# Patient Record
Sex: Female | Born: 1945 | Race: White | Hispanic: No | Smoking: Never smoker
Health system: Southern US, Community
[De-identification: ages and names within clinical notes are randomized; demographics above are authoritative.]

## PROBLEM LIST (undated history)

## (undated) ENCOUNTER — Emergency Department (HOSPITAL_COMMUNITY): Discharge status: Absent without leave | Payer: Self-pay

## (undated) DIAGNOSIS — K589 Irritable bowel syndrome without diarrhea: Secondary | ICD-10-CM

## (undated) HISTORY — PX: TONSILLECTOMY: SUR1361

---

## 2005-05-08 ENCOUNTER — Emergency Department (HOSPITAL_COMMUNITY): Admission: EM | Admit: 2005-05-08 | Discharge: 2005-05-09 | Payer: Self-pay | Admitting: Emergency Medicine

## 2014-12-02 ENCOUNTER — Emergency Department (HOSPITAL_COMMUNITY): Payer: Commercial Managed Care - HMO

## 2014-12-02 ENCOUNTER — Inpatient Hospital Stay (HOSPITAL_COMMUNITY)
Admission: EM | Admit: 2014-12-02 | Discharge: 2015-01-07 | DRG: 870 | Disposition: E | Payer: Commercial Managed Care - HMO | Attending: Emergency Medicine | Admitting: Emergency Medicine

## 2014-12-02 ENCOUNTER — Encounter (HOSPITAL_COMMUNITY): Payer: Self-pay | Admitting: *Deleted

## 2014-12-02 DIAGNOSIS — R1900 Intra-abdominal and pelvic swelling, mass and lump, unspecified site: Secondary | ICD-10-CM | POA: Diagnosis not present

## 2014-12-02 DIAGNOSIS — E860 Dehydration: Secondary | ICD-10-CM | POA: Diagnosis present

## 2014-12-02 DIAGNOSIS — K651 Peritoneal abscess: Secondary | ICD-10-CM | POA: Insufficient documentation

## 2014-12-02 DIAGNOSIS — B954 Other streptococcus as the cause of diseases classified elsewhere: Secondary | ICD-10-CM | POA: Diagnosis present

## 2014-12-02 DIAGNOSIS — R34 Anuria and oliguria: Secondary | ICD-10-CM | POA: Diagnosis present

## 2014-12-02 DIAGNOSIS — C801 Malignant (primary) neoplasm, unspecified: Secondary | ICD-10-CM | POA: Diagnosis present

## 2014-12-02 DIAGNOSIS — R601 Generalized edema: Secondary | ICD-10-CM | POA: Diagnosis present

## 2014-12-02 DIAGNOSIS — E871 Hypo-osmolality and hyponatremia: Secondary | ICD-10-CM | POA: Diagnosis present

## 2014-12-02 DIAGNOSIS — J9811 Atelectasis: Secondary | ICD-10-CM | POA: Diagnosis present

## 2014-12-02 DIAGNOSIS — D61818 Other pancytopenia: Secondary | ICD-10-CM | POA: Diagnosis present

## 2014-12-02 DIAGNOSIS — R591 Generalized enlarged lymph nodes: Secondary | ICD-10-CM | POA: Diagnosis present

## 2014-12-02 DIAGNOSIS — I5032 Chronic diastolic (congestive) heart failure: Secondary | ICD-10-CM | POA: Diagnosis present

## 2014-12-02 DIAGNOSIS — N179 Acute kidney failure, unspecified: Secondary | ICD-10-CM | POA: Diagnosis present

## 2014-12-02 DIAGNOSIS — R6521 Severe sepsis with septic shock: Secondary | ICD-10-CM | POA: Diagnosis present

## 2014-12-02 DIAGNOSIS — J9601 Acute respiratory failure with hypoxia: Secondary | ICD-10-CM | POA: Insufficient documentation

## 2014-12-02 DIAGNOSIS — Z978 Presence of other specified devices: Secondary | ICD-10-CM

## 2014-12-02 DIAGNOSIS — D689 Coagulation defect, unspecified: Secondary | ICD-10-CM | POA: Diagnosis present

## 2014-12-02 DIAGNOSIS — I471 Supraventricular tachycardia: Secondary | ICD-10-CM | POA: Diagnosis present

## 2014-12-02 DIAGNOSIS — E876 Hypokalemia: Secondary | ICD-10-CM | POA: Diagnosis present

## 2014-12-02 DIAGNOSIS — D6959 Other secondary thrombocytopenia: Secondary | ICD-10-CM | POA: Diagnosis present

## 2014-12-02 DIAGNOSIS — Z515 Encounter for palliative care: Secondary | ICD-10-CM

## 2014-12-02 DIAGNOSIS — Z6826 Body mass index (BMI) 26.0-26.9, adult: Secondary | ICD-10-CM | POA: Diagnosis not present

## 2014-12-02 DIAGNOSIS — J9600 Acute respiratory failure, unspecified whether with hypoxia or hypercapnia: Secondary | ICD-10-CM

## 2014-12-02 DIAGNOSIS — Z66 Do not resuscitate: Secondary | ICD-10-CM | POA: Diagnosis present

## 2014-12-02 DIAGNOSIS — E43 Unspecified severe protein-calorie malnutrition: Secondary | ICD-10-CM | POA: Diagnosis present

## 2014-12-02 DIAGNOSIS — R000 Tachycardia, unspecified: Secondary | ICD-10-CM

## 2014-12-02 DIAGNOSIS — R7989 Other specified abnormal findings of blood chemistry: Secondary | ICD-10-CM

## 2014-12-02 DIAGNOSIS — C786 Secondary malignant neoplasm of retroperitoneum and peritoneum: Secondary | ICD-10-CM | POA: Diagnosis present

## 2014-12-02 DIAGNOSIS — A419 Sepsis, unspecified organism: Principal | ICD-10-CM | POA: Diagnosis present

## 2014-12-02 DIAGNOSIS — R109 Unspecified abdominal pain: Secondary | ICD-10-CM

## 2014-12-02 DIAGNOSIS — K631 Perforation of intestine (nontraumatic): Secondary | ICD-10-CM | POA: Diagnosis present

## 2014-12-02 DIAGNOSIS — K567 Ileus, unspecified: Secondary | ICD-10-CM | POA: Diagnosis present

## 2014-12-02 DIAGNOSIS — D638 Anemia in other chronic diseases classified elsewhere: Secondary | ICD-10-CM | POA: Diagnosis present

## 2014-12-02 DIAGNOSIS — G92 Toxic encephalopathy: Secondary | ICD-10-CM | POA: Diagnosis present

## 2014-12-02 DIAGNOSIS — K589 Irritable bowel syndrome without diarrhea: Secondary | ICD-10-CM | POA: Diagnosis present

## 2014-12-02 DIAGNOSIS — R0602 Shortness of breath: Secondary | ICD-10-CM

## 2014-12-02 DIAGNOSIS — K630 Abscess of intestine: Secondary | ICD-10-CM | POA: Insufficient documentation

## 2014-12-02 DIAGNOSIS — N289 Disorder of kidney and ureter, unspecified: Secondary | ICD-10-CM

## 2014-12-02 DIAGNOSIS — Z820 Family history of epilepsy and other diseases of the nervous system: Secondary | ICD-10-CM | POA: Diagnosis not present

## 2014-12-02 DIAGNOSIS — N838 Other noninflammatory disorders of ovary, fallopian tube and broad ligament: Secondary | ICD-10-CM | POA: Insufficient documentation

## 2014-12-02 DIAGNOSIS — M7989 Other specified soft tissue disorders: Secondary | ICD-10-CM

## 2014-12-02 DIAGNOSIS — R531 Weakness: Secondary | ICD-10-CM | POA: Diagnosis present

## 2014-12-02 DIAGNOSIS — K5660 Unspecified intestinal obstruction: Secondary | ICD-10-CM | POA: Diagnosis present

## 2014-12-02 DIAGNOSIS — Z452 Encounter for adjustment and management of vascular access device: Secondary | ICD-10-CM

## 2014-12-02 HISTORY — DX: Irritable bowel syndrome without diarrhea: K58.9

## 2014-12-02 LAB — CBC WITH DIFFERENTIAL/PLATELET
BASOS ABS: 0 10*3/uL (ref 0.0–0.1)
Basophils Relative: 0 % (ref 0–1)
EOS PCT: 0 % (ref 0–5)
Eosinophils Absolute: 0 10*3/uL (ref 0.0–0.7)
HEMATOCRIT: 27.3 % — AB (ref 36.0–46.0)
Hemoglobin: 9.3 g/dL — ABNORMAL LOW (ref 12.0–15.0)
LYMPHS ABS: 1.2 10*3/uL (ref 0.7–4.0)
Lymphocytes Relative: 4 % — ABNORMAL LOW (ref 12–46)
MCH: 26.6 pg (ref 26.0–34.0)
MCHC: 34.1 g/dL (ref 30.0–36.0)
MCV: 78 fL (ref 78.0–100.0)
MONO ABS: 0.3 10*3/uL (ref 0.1–1.0)
MONOS PCT: 1 % — AB (ref 3–12)
NEUTROS ABS: 28.2 10*3/uL — AB (ref 1.7–7.7)
Neutrophils Relative %: 95 % — ABNORMAL HIGH (ref 43–77)
Platelets: 480 10*3/uL — ABNORMAL HIGH (ref 150–400)
RBC: 3.5 MIL/uL — ABNORMAL LOW (ref 3.87–5.11)
RDW: 18.1 % — AB (ref 11.5–15.5)
WBC Morphology: INCREASED
WBC: 29.7 10*3/uL — ABNORMAL HIGH (ref 4.0–10.5)

## 2014-12-02 LAB — COMPREHENSIVE METABOLIC PANEL
ALK PHOS: 87 U/L (ref 39–117)
ALT: 14 U/L (ref 0–35)
ANION GAP: 13 (ref 5–15)
AST: 21 U/L (ref 0–37)
Albumin: 1.8 g/dL — ABNORMAL LOW (ref 3.5–5.2)
BUN: 33 mg/dL — AB (ref 6–23)
CO2: 24 mmol/L (ref 19–32)
CREATININE: 1.53 mg/dL — AB (ref 0.50–1.10)
Calcium: 6.8 mg/dL — ABNORMAL LOW (ref 8.4–10.5)
Chloride: 95 mmol/L — ABNORMAL LOW (ref 96–112)
GFR calc non Af Amer: 33 mL/min — ABNORMAL LOW (ref >90)
GFR, EST AFRICAN AMERICAN: 39 mL/min — AB (ref >90)
GLUCOSE: 134 mg/dL — AB (ref 70–99)
Potassium: 2.5 mmol/L — CL (ref 3.5–5.1)
SODIUM: 132 mmol/L — AB (ref 135–145)
TOTAL PROTEIN: 5.7 g/dL — AB (ref 6.0–8.3)
Total Bilirubin: 1.4 mg/dL — ABNORMAL HIGH (ref 0.3–1.2)

## 2014-12-02 LAB — URINALYSIS, ROUTINE W REFLEX MICROSCOPIC
Glucose, UA: NEGATIVE mg/dL
Ketones, ur: NEGATIVE mg/dL
NITRITE: NEGATIVE
PH: 5 (ref 5.0–8.0)
PROTEIN: NEGATIVE mg/dL
SPECIFIC GRAVITY, URINE: 1.021 (ref 1.005–1.030)
UROBILINOGEN UA: 1 mg/dL (ref 0.0–1.0)

## 2014-12-02 LAB — URINE MICROSCOPIC-ADD ON

## 2014-12-02 LAB — I-STAT CG4 LACTIC ACID, ED: LACTIC ACID, VENOUS: 2.85 mmol/L — AB (ref 0.5–2.0)

## 2014-12-02 LAB — LIPASE, BLOOD: LIPASE: 10 U/L — AB (ref 11–59)

## 2014-12-02 LAB — I-STAT TROPONIN, ED: Troponin i, poc: 0 ng/mL (ref 0.00–0.08)

## 2014-12-02 LAB — BRAIN NATRIURETIC PEPTIDE: B NATRIURETIC PEPTIDE 5: 137.7 pg/mL — AB (ref 0.0–100.0)

## 2014-12-02 MED ORDER — IOHEXOL 300 MG/ML  SOLN
50.0000 mL | Freq: Once | INTRAMUSCULAR | Status: AC | PRN
  Administered 2014-12-02: 50 mL via ORAL

## 2014-12-02 MED ORDER — PIPERACILLIN-TAZOBACTAM 3.375 G IVPB
3.3750 g | Freq: Once | INTRAVENOUS | Status: AC
  Administered 2014-12-02: 3.375 g via INTRAVENOUS
  Filled 2014-12-02: qty 50

## 2014-12-02 MED ORDER — PIPERACILLIN-TAZOBACTAM 3.375 G IVPB
3.3750 g | Freq: Three times a day (TID) | INTRAVENOUS | Status: DC
  Administered 2014-12-03 (×2): 3.375 g via INTRAVENOUS
  Filled 2014-12-02 (×3): qty 50

## 2014-12-02 MED ORDER — POTASSIUM CHLORIDE 10 MEQ/100ML IV SOLN
10.0000 meq | INTRAVENOUS | Status: AC
  Administered 2014-12-02 – 2014-12-03 (×6): 10 meq via INTRAVENOUS
  Filled 2014-12-02 (×5): qty 100

## 2014-12-02 MED ORDER — ACETAMINOPHEN 650 MG RE SUPP: 650.0000 mg | Freq: Four times a day (QID) | RECTAL | Status: DC | PRN

## 2014-12-02 MED ORDER — SODIUM CHLORIDE 0.9 % IJ SOLN
3.0000 mL | Freq: Two times a day (BID) | INTRAMUSCULAR | Status: DC
  Administered 2014-12-04 – 2014-12-10 (×7): 3 mL via INTRAVENOUS

## 2014-12-02 MED ORDER — SODIUM CHLORIDE 0.9 % IV BOLUS (SEPSIS)
1000.0000 mL | Freq: Once | INTRAVENOUS | Status: AC
  Administered 2014-12-02: 1000 mL via INTRAVENOUS

## 2014-12-02 MED ORDER — POTASSIUM CHLORIDE IN NACL 40-0.9 MEQ/L-% IV SOLN
INTRAVENOUS | Status: DC
  Administered 2014-12-03: 100 mL/h via INTRAVENOUS
  Filled 2014-12-02 (×4): qty 1000

## 2014-12-02 MED ORDER — ACETAMINOPHEN 325 MG PO TABS: 650.0000 mg | ORAL_TABLET | Freq: Four times a day (QID) | ORAL | Status: DC | PRN

## 2014-12-02 NOTE — H&P (Addendum)
Nicole Cannon is an 69 y.o. female.    Nicole Cannon (pcp, [name])  Chief Complaint: weakness HPI: 69 yo female with c/o generalized weakness while at work.  Pt notes swelling in her legs for the past 2 weeks.  Pt also notes slight abdominal distention. No prior colonoscopy. Presented to ED for evaluation and found to be hypokalemic, and mild renal insufficiency.  Pt had CT scan which showed ? Pelvic mass and ? Pelvic carcinomatosis. Pt had elevated wbc but denies fever, n/v, abd pain, diarrhea, constipation, brbpr, black stool.  Pt will be admitted for w/up of ? Abscess, carcinomatosis, hypokalemia and mild renal insufficiency.   Past Medical History  Diagnosis Date  . Irritable bowel     Past Surgical History  Procedure Laterality Date  . Tonsillectomy      Family History  Problem Relation Age of Onset  . Dementia Father   . Parkinson's disease Father    Social History:  reports that she has never smoked. She does not have any smokeless tobacco history on file. She reports that she does not drink alcohol. Her drug history is not on file.  Allergies: No Known Allergies Medications reviewed   (Not in a hospital admission)  Results for orders placed or performed during the hospital encounter of 11/10/2014 (from the past 48 hour(s))  CBC with Differential     Status: Abnormal   Collection Time: 11/28/2014  4:01 PM  Result Value Ref Range   WBC 29.7 (H) 4.0 - 10.5 K/uL   RBC 3.50 (L) 3.87 - 5.11 MIL/uL   Hemoglobin 9.3 (L) 12.0 - 15.0 g/dL   HCT 27.3 (L) 36.0 - 46.0 %   MCV 78.0 78.0 - 100.0 fL   MCH 26.6 26.0 - 34.0 pg   MCHC 34.1 30.0 - 36.0 g/dL   RDW 18.1 (H) 11.5 - 15.5 %   Platelets 480 (H) 150 - 400 K/uL   Neutrophils Relative % 95 (H) 43 - 77 %   Lymphocytes Relative 4 (L) 12 - 46 %   Monocytes Relative 1 (L) 3 - 12 %   Eosinophils Relative 0 0 - 5 %   Basophils Relative 0 0 - 1 %   Neutro Abs 28.2 (H) 1.7 - 7.7 K/uL   Lymphs Abs 1.2 0.7 - 4.0 K/uL    Monocytes Absolute 0.3 0.1 - 1.0 K/uL   Eosinophils Absolute 0.0 0.0 - 0.7 K/uL   Basophils Absolute 0.0 0.0 - 0.1 K/uL   RBC Morphology POLYCHROMASIA PRESENT    WBC Morphology INCREASED BANDS (>20% BANDS)     Comment: MILD LEFT SHIFT (1-5% METAS, OCC MYELO, OCC BANDS) TOXIC GRANULATION VACUOLATED NEUTROPHILS   Comprehensive metabolic panel     Status: Abnormal   Collection Time: 11/24/2014  4:01 PM  Result Value Ref Range   Sodium 132 (L) 135 - 145 mmol/L   Potassium 2.5 (LL) 3.5 - 5.1 mmol/L    Comment: RESULT REPEATED AND VERIFIED CRITICAL RESULT CALLED TO, READ BACK BY AND VERIFIED WITH: E.BLAKELY AT 1648 ON 11/10/2014 BY S.VANHOORNE    Chloride 95 (L) 96 - 112 mmol/L   CO2 24 19 - 32 mmol/L   Glucose, Bld 134 (H) 70 - 99 mg/dL   BUN 33 (H) 6 - 23 mg/dL   Creatinine, Ser 1.53 (H) 0.50 - 1.10 mg/dL   Calcium 6.8 (L) 8.4 - 10.5 mg/dL   Total Protein 5.7 (L) 6.0 - 8.3 g/dL   Albumin 1.8 (L) 3.5 -  5.2 g/dL   AST 21 0 - 37 U/L   ALT 14 0 - 35 U/L   Alkaline Phosphatase 87 39 - 117 U/L   Total Bilirubin 1.4 (H) 0.3 - 1.2 mg/dL   GFR calc non Af Amer 33 (L) >90 mL/min   GFR calc Af Amer 39 (L) >90 mL/min    Comment: (NOTE) The eGFR has been calculated using the CKD EPI equation. This calculation has not been validated in all clinical situations. eGFR's persistently <90 mL/min signify possible Chronic Kidney Disease.    Anion gap 13 5 - 15  Lipase, blood     Status: Abnormal   Collection Time: 11/27/2014  4:01 PM  Result Value Ref Range   Lipase 10 (L) 11 - 59 U/L  Brain natriuretic peptide     Status: Abnormal   Collection Time: 11/25/2014  4:01 PM  Result Value Ref Range   B Natriuretic Peptide 137.7 (H) 0.0 - 100.0 pg/mL  I-stat troponin, ED     Status: None   Collection Time: 11/29/2014  4:12 PM  Result Value Ref Range   Troponin i, poc 0.00 0.00 - 0.08 ng/mL   Comment 3            Comment: Due to the release kinetics of cTnI, a negative result within the first hours of the  onset of symptoms does not rule out myocardial infarction with certainty. If myocardial infarction is still suspected, repeat the test at appropriate intervals.   I-Stat CG4 Lactic Acid, ED     Status: Abnormal   Collection Time: 11/26/2014  4:15 PM  Result Value Ref Range   Lactic Acid, Venous 2.85 (HH) 0.5 - 2.0 mmol/L   Comment NOTIFIED PHYSICIAN   Urinalysis, Routine w reflex microscopic     Status: Abnormal   Collection Time: 11/20/2014  5:33 PM  Result Value Ref Range   Color, Urine ORANGE (A) YELLOW    Comment: BIOCHEMICALS MAY BE AFFECTED BY COLOR   APPearance TURBID (A) CLEAR   Specific Gravity, Urine 1.021 1.005 - 1.030   pH 5.0 5.0 - 8.0   Glucose, UA NEGATIVE NEGATIVE mg/dL   Hgb urine dipstick TRACE (A) NEGATIVE   Bilirubin Urine MODERATE (A) NEGATIVE   Ketones, ur NEGATIVE NEGATIVE mg/dL   Protein, ur NEGATIVE NEGATIVE mg/dL   Urobilinogen, UA 1.0 0.0 - 1.0 mg/dL   Nitrite NEGATIVE NEGATIVE   Leukocytes, UA TRACE (A) NEGATIVE  Urine microscopic-add on     Status: Abnormal   Collection Time: 11/17/2014  5:33 PM  Result Value Ref Range   Squamous Epithelial / LPF RARE RARE   WBC, UA 3-6 <3 WBC/hpf   RBC / HPF 0-2 <3 RBC/hpf   Bacteria, UA MANY (A) RARE   Casts HYALINE CASTS (A) NEGATIVE   Ct Abdomen Pelvis Wo Contrast  11/08/2014   CLINICAL DATA:  Edema in the lower extremities beginning about 3 weeks ago and progressively worsening. Weakness beginning this week worsening. Diffuse abdominal pain and nausea for 2 weeks.  EXAM: CT ABDOMEN AND PELVIS WITHOUT CONTRAST  TECHNIQUE: Multidetector CT imaging of the abdomen and pelvis was performed following the standard protocol without IV contrast.  COMPARISON:  None.  FINDINGS: Small right pleural effusion with basilar atelectasis. Small pericardial effusion. Mild cardiac enlargement.  Evaluation of solid organs and vascular structures is limited without IV contrast material. Evaluation of bowel is limited without oral contrast.  Loculated appearing fluid collections are demonstrated in the right upper quadrant posterior  to the liver and extending inferior to the liver along the right lateral abdominal and pelvic wall. Additional fluid collections are suggested in the mesenteric. There is an air-fluid level in the central mesentery to the left which could represent a focal dilated bowel loop or an abscess. Loculated hyperdense fluid collections versus mass lesions in the pelvis. Nodular lesions in the mesentery. No free air. Small bowel are mildly dilated with diffuse wall thickening. Bladder is decompressed with mild wall thickening. I believe that the changes with most likely represent pelvic masses with diffuse peritoneal carcinomatosis and associated fluid collections. Abscess or inflammatory process are not excluded. Diffuse enteritis is not excluded. Repeat imaging with oral and IV contrast material would be helpful to better define the abnormalities if the patient can't tolerate contrast.  Tiny subcentimeter lesion in the right lobe of the liver is indeterminate but probably represents a cyst. Gallbladder is mildly contracted. No stones or wall thickening. No bile duct dilatation. Unenhanced appearance of the spleen, pancreas, adrenal glands, kidneys, abdominal aorta, and inferior vena cava is unremarkable. Scattered retroperitoneal and mesenteric lymph nodes are not pathologically enlarged. No free air in the abdomen.  Pelvis: See above. 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 No destructive bone lesions appreciated. Diffuse soft tissue edema.  IMPRESSION: Examination is technically limited without IV contrast material and oral contrast material. 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 Small bowel are mildly dilated with diffusely thickened wall, possibly due to tumor infiltration, reactive inflammation, or enteritis. Central  abdominal abscess is not excluded. Multiple loculated fluid collections. Small right pleural effusion with basilar atelectasis. Diffuse edema.   Electronically Signed   By: Lucienne Capers M.D.   On: 11/16/2014 19:13   Dg Chest 2 View  12/05/2014   CLINICAL DATA:  Shortness of breath, leg swelling  EXAM: CHEST  2 VIEW  COMPARISON:  None.  FINDINGS: Cardiomediastinal silhouette is unremarkable. Mild degenerative changes thoracic spine. There is elevation of the right hemidiaphragm. No acute infiltrate or pulmonary edema. Mild deformity of proximal shaft of the right humerus question prior fracture.  IMPRESSION: No active disease. There is elevation of the right hemidiaphragm. Mild degenerative changes thoracic spine. Question prior fracture proximal shaft of right humerus.   Electronically Signed   By: Lahoma Crocker M.D.   On: 11/20/2014 16:18    Review of Systems  Constitutional: Positive for weight loss. Negative for fever, chills, malaise/fatigue and diaphoresis.       50 lbs since June  HENT: Negative for congestion, ear discharge, ear pain, hearing loss, nosebleeds, sore throat and tinnitus.   Eyes: Negative for blurred vision, double vision, photophobia, pain, discharge and redness.  Respiratory: Negative for cough, hemoptysis, sputum production, shortness of breath, wheezing and stridor.   Cardiovascular: Negative for chest pain, palpitations, orthopnea, claudication, leg swelling and PND.  Gastrointestinal: Negative for heartburn, nausea, vomiting, abdominal pain, diarrhea, constipation, blood in stool and melena.  Genitourinary: Negative for dysuria, urgency, frequency, hematuria and flank pain.  Musculoskeletal: Negative for myalgias, back pain, joint pain, falls and neck pain.  Skin: Negative for itching and rash.  Neurological: Negative for dizziness, tingling, tremors, sensory change, speech change, focal weakness, seizures, loss of consciousness, weakness and headaches.   Endo/Heme/Allergies: Negative for environmental allergies and polydipsia. Does not bruise/bleed easily.  Psychiatric/Behavioral: Negative for depression, suicidal ideas, hallucinations, memory loss and substance abuse. The patient is not nervous/anxious and does not have insomnia.  Blood pressure 122/56, pulse 99, temperature 97 F (36.1 C), temperature source Oral, resp. rate 36, SpO2 94 %. Physical Exam  Constitutional: She is oriented to person, place, and time. She appears well-developed and well-nourished.  HENT:  Head: Normocephalic and atraumatic.  Mouth/Throat: No oropharyngeal exudate.  Eyes: Conjunctivae and EOM are normal. Pupils are equal, round, and reactive to light. No scleral icterus.  Neck: Normal range of motion. Neck supple. No JVD present. No tracheal deviation present. No thyromegaly present.  Cardiovascular: Normal rate and regular rhythm.  Exam reveals no gallop and no friction rub.   No murmur heard. Respiratory: Effort normal and breath sounds normal. No respiratory distress. She has no wheezes. She has no rales. She exhibits no tenderness.  GI: Soft. Bowel sounds are normal. She exhibits distension. There is no tenderness. There is no rebound and no guarding.  Musculoskeletal: Normal range of motion. She exhibits edema. She exhibits no tenderness.  Lymphadenopathy:    She has no cervical adenopathy.  Neurological: She is alert and oriented to person, place, and time. She has normal reflexes. She displays normal reflexes. No cranial nerve deficit. She exhibits normal muscle tone. Coordination normal.  Skin: Skin is warm and dry. No rash noted. No erythema. No pallor.  Psychiatric: She has a normal mood and affect. Her behavior is normal. Judgment and thought content normal.     Assessment/Plan Hypokalemia Replete potassium Check magnesium  Check cmp in am  Leukocytosis Infection of some sort  ? Abscess Cover with zosyn iv pharmacy to dose Repeat CT scan  with oral and iv contrast in am if creatinine is improving  Pelvic mass Appreciate surgery input Check LDH, CEA, CA 125  Renal insufficiency Hydrate with ns iv Check cmp in am  Anemia Check cbc in am Check iron studies, b12, folate, tsh, esr, pt, ptt  Lower ext edema Check lower ext ultrasound r/o DVT in setting of possible malignancy  Tachycardia Check trop i 16h x3 Check d dimer, if positive, then lovenox 80m /kg [place] x1 and then order CTA chest vs VQ scan in am Consider echo if tachycardia persistent  DVT prophylaxis: scd KJani Gravel2/25/2016, 10:33 PM

## 2014-12-02 NOTE — ED Notes (Signed)
[place], [place] made aware of patient CG4 Lactic results.

## 2014-12-02 NOTE — ED Notes (Signed)
Patient began to notice edema to bilateral extremities about 3 weeks ago that has gotten progressively worse. Patient denies previous history and the weakness began the beginning of this week and has gotten progressively worse to the point where the patient was unable to get up today.

## 2014-12-02 NOTE — ED Notes (Signed)
Critical Value: K 2.5 Called by Dot Been.

## 2014-12-02 NOTE — Progress Notes (Signed)
EDCM spoke to patient and [name] at bedside.  Patient confirms she lives at home alone.  Patient reports her pcp is Dr. Briscoe Deutscher who she saw a "few months ago."  Patient reports she does not have any home health services and never has.  Patient reports she is able to perform all of her ADL's without assistance.  Patient's son Trevor's phone number [phone] and her neighbor Renne Musca [phone].  Patient without EDCM needs at this time.

## 2014-12-02 NOTE — ED Notes (Signed)
Attending at bedside.

## 2014-12-02 NOTE — Consult Note (Addendum)
Re:   Nicole Cannon DOB:   [birth year] MRN:   [id number]   [facility] consultation  ASSESSMENT AND PLAN: 1.  Intra-abdominal abscess  Agree with antibiotics.  Plan repeat CT scan tomorrow with at least oral contrast to better define the bowel/abscess  Then consider perc drain as possible.  She would not tolerate a laparotomy well at this time.  Discussed plan with Dr. Georges Mouse 2.  Question intra-abdominal tumor mass vs carcinomatosis  She appears to have a tumor mass.  Repeat CT scan may define this better.  Will need tissue via IR if possible. 3.  Elevated creatinine  4.  Malnourished  Albumin - 1.9  Consider TPN while sorting her diagnoses out. 5.  Anemia  Probably will go down further with hydration 6.  Lower extremity edema  Proably secondary to malnutrition, possible lymphatic obstruction 7.  Hypokalemia  Chief Complaint  Patient presents with  . Leg Swelling  . Weakness   REFERRING PHYSICIAN: No PCP Per Patient  HISTORY OF PRESENT ILLNESS: Nicole Cannon is a 69 y.o. (DOB: 1946-03-08)  white  female whose primary care physician is No PCP Per Patient (she sees Dr. Maceo Pro as her PCP) and comes to the [facility] by ambulance for swelling legs and poor appetitie. Her son, Sherrilyn Rist and wife, Adria Devon, are in the room.  This is a strange story.  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  Over the last several months, she has had "colitis" and gone to an alternative doctor.  She could not remember the alternative doctors name (or did not want to give it to me).  He treated her colitis with a diet.  She denies any meds.  Then over the last 3 weeks, she has noticed increased swelling of her lower extremities.  She has had increased difficulty walking and could not drive her car to work.  Her neighbor thought she was having a stroke and called the ambulance.  She said it is "painful to eat".  She as lost 50 pounds.  She attributed this weight loss to the diet she was put on for the  colitis.  She has not had a colonoscopy.  She has no prior GI history, until 3 months ago.  CT abdomen - 11/08/2014 - Examination is technically limited without IV contrast material and oral contrast material. 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 Small bowel are mildly dilated with diffusely thickened wall, possibly due to tumor infiltration, reactive inflammation, or enteritis. Central abdominal abscess is not excluded. Multiple loculated fluid collections. Small right pleural effusion with basilar atelectasis. Diffuse edema.  WBC - 29,700 - 12/04/2014 Hgb - 9.3 - 11/26/2014 Creat - 1.53 - 11/12/2014 Albumin - 1.8    History reviewed. No pertinent past medical history.    Past Surgical History  Procedure Laterality Date  . Tonsillectomy        Current Facility-Administered Medications  Medication Dose Route Frequency Provider Last Rate Last Dose  . piperacillin-tazobactam (ZOSYN) IVPB 3.375 g  3.375 g Intravenous Once Blanchie Dessert, MD 12.5 mL/hr at 11/23/2014 2028 3.375 g at 11/28/2014 2028  . potassium chloride 10 mEq in 100 mL IVPB  10 mEq Intravenous Q1 Hr x 6 Carrie Mew, PA-C 100 mL/hr at 11/30/2014 2114 10 mEq at 11/21/2014 2114   No current outpatient prescriptions on file.     No Known Allergies  REVIEW OF SYSTEMS: Skin:  No  history of rash.  No history of abnormal moles. Infection:  No history of hepatitis or HIV.  No history of MRSA. Neurologic:  No history of stroke.  No history of seizure.  No history of headaches. Cardiac:  No history of hypertension. No history of heart disease.  No history of prior cardiac catheterization.  No history of seeing a cardiologist. Pulmonary:  Does not smoke cigarettes.  No asthma or bronchitis.  No OSA/CPAP.  Endocrine:  No diabetes. No thyroid disease. Gastrointestinal:  No history of stomach disease.  No history of liver disease.  No  history of gall bladder disease.  No history of pancreas disease.  No history of colon disease. Urologic:  No history of kidney stones.  No history of bladder infections. Musculoskeletal:  No history of joint or back disease. Hematologic:  No bleeding disorder.  No history of anemia.  Not anticoagulated. Psycho-social:  The patient is oriented.  I think she is in some denial of her condition. Somewhat odd affect.  SOCIAL and FAMILY HISTORY: Divorced.  She lives by herself. Her son, Sherrilyn Rist and wife, Adria Devon, are in the room.  [name] was texting/typing on the cell phone most of the time I was interviewing his mother. She has one other son who lives near here.  PHYSICAL EXAM: BP 122/56 mmHg  Pulse 99  Temp(Src) 97 F (36.1 C) (Oral)  Resp 36  SpO2 94%  General: Older WF who is alert, but does not look healthy. HEENT: Normal. Pupils equal. Neck: Supple. No mass.  No thyroid mass. Lymph Nodes:  No supraclavicular or cervical nodes. Lungs: Clear to auscultation and symmetric breath sounds. Heart:  Tachycardic.  Has 2/6 systolic murmur. Breasts:  No mass  Abdomen: Diffusely distended.  Somewhat tender.  Firm.  No discrete mass. Rectal: Not done. Extremities:  3+ lower extremity edema.  She moves both lower extremities, but slowly and she says it hurts. Neurologic:  Grossly intact to motor and sensory function. Psychiatric: Has normal mood and affect.   DATA REVIEWED: Epic notes  Alphonsa Overall, MD,  [facility], [place] [address]  [place], [place]    [place] Phone:  [phone] FAX:  [phone]

## 2014-12-02 NOTE — ED Notes (Signed)
Pt. Currently on 2nd bag of Potassium chloride IV.

## 2014-12-02 NOTE — ED Provider Notes (Signed)
CSN: [id number]     Arrival date & time 11/25/2014  1445 History   First MD Initiated Contact with Patient 11/27/2014 1501     Chief Complaint  Patient presents with  . Leg Swelling  . Weakness     (Consider location/radiation/quality/duration/timing/severity/associated sxs/prior Treatment) HPI Nicole Cannon is a 69 year old female with no known past medical history who presents the ER complaining of swelling to bilateral lower extremities along with generalized weakness and mild shortness of breath. Patient reports edema in her extremities began approximately 3 weeks ago after having a "several week long episode of colitis". Patient states the swelling has gotten progressively worse over the past 3 weeks. Patient reports associated weakness in her leg stating it is hard to move her legs due to the swelling. Patient reports mild shortness of breath increasing over the past several days, denies having any symptoms similar to this in the past. Patient also reports an occasional sharp, diffuse abdominal pain which last happened approximately 3 days ago. She states this pain resolved on its own. Patient denies dizziness, blurred vision, headache, chest pain, cough, fever, nausea, vomiting, diarrhea, dysuria.  Past Medical History  Diagnosis Date  . Irritable bowel    Past Surgical History  Procedure Laterality Date  . Tonsillectomy     Family History  Problem Relation Age of Onset  . Dementia Father   . Parkinson's disease Father    History  Substance Use Topics  . Smoking status: Never Smoker   . Smokeless tobacco: Not on file  . Alcohol Use: No   OB History    No data available     Review of Systems  Constitutional: Negative for fever.  HENT: Negative for trouble swallowing.   Eyes: Negative for visual disturbance.  Respiratory: Positive for shortness of breath.   Cardiovascular: Negative for chest pain.  Gastrointestinal: Positive for abdominal pain. Negative for nausea and  vomiting.  Genitourinary: Negative for dysuria.  Musculoskeletal: Negative for neck pain.  Skin: Negative for rash.       Pedal edema  Neurological: Negative for dizziness and numbness.  Psychiatric/Behavioral: Negative.       Allergies  Review of patient's allergies indicates no known allergies.  Home Medications   Prior to Admission medications   Not on File   BP 152/77 mmHg  Pulse 101  Temp(Src) 97.7 F (36.5 C) (Oral)  Resp 22  SpO2 96% Physical Exam  Constitutional: She is oriented to person, place, and time. She appears well-developed and well-nourished. No distress.  Elderly, frail appearing female in no obvious distress.  HENT:  Head: Normocephalic and atraumatic.  Mouth/Throat: Oropharynx is clear and moist. No oropharyngeal exudate.  Eyes: Right eye exhibits no discharge. Left eye exhibits no discharge. No scleral icterus.  Neck: Normal range of motion.  Cardiovascular: Regular rhythm, S1 normal, S2 normal and normal heart sounds.  Tachycardia present.   No murmur heard. Tachycardic at 105  Pulmonary/Chest: Effort normal and breath sounds normal. No accessory muscle usage. No tachypnea. No respiratory distress.  Abdominal: Soft. There is no tenderness.  Musculoskeletal: Normal range of motion. She exhibits no edema or tenderness.  Lymphadenopathy:  4+ pitting edema noted to feet bilaterally extending up to knees. 2+ pitting edema noted from knees approximately 2 upper thighs. 2+ pitting edema also noted in hands.  Neurological: She is alert and oriented to person, place, and time. She has normal strength. No cranial nerve deficit or sensory deficit. Coordination normal. GCS eye subscore is 4. GCS  verbal subscore is 5. GCS motor subscore is 6.  Patient fully alert, answering questions appropriately in full, clear sentences. Cranial nerves II through XII grossly intact. Motor strength 5 out of 5 in all major muscle groups of upper and lower extremities, however exam  of hip and knee flexors limited due to pedal edema. Distal sensation intact.  Skin: Skin is warm and dry. No rash noted. She is not diaphoretic.  Psychiatric: She has a normal mood and affect.  Nursing note and vitals reviewed.   ED Course  Procedures (including critical care time) Labs Review Labs Reviewed  CBC WITH DIFFERENTIAL/PLATELET - Abnormal; Notable for the following:    WBC 29.7 (*)    RBC 3.50 (*)    Hemoglobin 9.3 (*)    HCT 27.3 (*)    RDW 18.1 (*)    Platelets 480 (*)    Neutrophils Relative % 95 (*)    Lymphocytes Relative 4 (*)    Monocytes Relative 1 (*)    Neutro Abs 28.2 (*)    All other components within normal limits  COMPREHENSIVE METABOLIC PANEL - Abnormal; Notable for the following:    Sodium 132 (*)    Potassium 2.5 (*)    Chloride 95 (*)    Glucose, Bld 134 (*)    BUN 33 (*)    Creatinine, Ser 1.53 (*)    Calcium 6.8 (*)    Total Protein 5.7 (*)    Albumin 1.8 (*)    Total Bilirubin 1.4 (*)    GFR calc non Af Amer 33 (*)    GFR calc Af Amer 39 (*)    All other components within normal limits  LIPASE, BLOOD - Abnormal; Notable for the following:    Lipase 10 (*)    All other components within normal limits  URINALYSIS, ROUTINE W REFLEX MICROSCOPIC - Abnormal; Notable for the following:    Color, Urine ORANGE (*)    APPearance TURBID (*)    Hgb urine dipstick TRACE (*)    Bilirubin Urine MODERATE (*)    Leukocytes, UA TRACE (*)    All other components within normal limits  BRAIN NATRIURETIC PEPTIDE - Abnormal; Notable for the following:    B Natriuretic Peptide 137.7 (*)    All other components within normal limits  URINE MICROSCOPIC-ADD ON - Abnormal; Notable for the following:    Bacteria, UA MANY (*)    Casts HYALINE CASTS (*)    All other components within normal limits  I-STAT CG4 LACTIC ACID, ED - Abnormal; Notable for the following:    Lactic Acid, Venous 2.85 (*)    All other components within normal limits  MAGNESIUM  CEA   LACTATE DEHYDROGENASE  CA 125  APTT  PROTIME-INR  CBC WITH DIFFERENTIAL/PLATELET  COMPREHENSIVE METABOLIC PANEL  TSH  VITAMIN B12  FOLATE RBC  FERRITIN  IRON AND TIBC  SEDIMENTATION RATE  TROPONIN I  TROPONIN I  TROPONIN I  D-DIMER, QUANTITATIVE  I-STAT TROPOININ, ED    Imaging Review Ct Abdomen Pelvis Wo Contrast  11/18/2014   CLINICAL DATA:  Edema in the lower extremities beginning about 3 weeks ago and progressively worsening. Weakness beginning this week worsening. Diffuse abdominal pain and nausea for 2 weeks.  EXAM: CT ABDOMEN AND PELVIS WITHOUT CONTRAST  TECHNIQUE: Multidetector CT imaging of the abdomen and pelvis was performed following the standard protocol without IV contrast.  COMPARISON:  None.  FINDINGS: Small right pleural effusion with basilar atelectasis. Small pericardial effusion. Mild  cardiac enlargement.  Evaluation of solid organs and vascular structures is limited without IV contrast material. Evaluation of bowel is limited without oral contrast. Loculated appearing fluid collections are demonstrated in the right upper quadrant posterior to the liver and extending inferior to the liver along the right lateral abdominal and pelvic wall. Additional fluid collections are suggested in the mesenteric. There is an air-fluid level in the central mesentery to the left which could represent a focal dilated bowel loop or an abscess. Loculated hyperdense fluid collections versus mass lesions in the pelvis. Nodular lesions in the mesentery. No free air. Small bowel are mildly dilated with diffuse wall thickening. Bladder is decompressed with mild wall thickening. I believe that the changes with most likely represent pelvic masses with diffuse peritoneal carcinomatosis and associated fluid collections. Abscess or inflammatory process are not excluded. Diffuse enteritis is not excluded. Repeat imaging with oral and IV contrast material would be helpful to better define the  abnormalities if the patient can't tolerate contrast.  Tiny subcentimeter lesion in the right lobe of the liver is indeterminate but probably represents a cyst. Gallbladder is mildly contracted. No stones or wall thickening. No bile duct dilatation. Unenhanced appearance of the spleen, pancreas, adrenal glands, kidneys, abdominal aorta, and inferior vena cava is unremarkable. Scattered retroperitoneal and mesenteric lymph nodes are not pathologically enlarged. No free air in the abdomen.  Pelvis: See above. 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 No destructive bone lesions appreciated. Diffuse soft tissue edema.  IMPRESSION: Examination is technically limited without IV contrast material and oral contrast material. 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 Small bowel are mildly dilated with diffusely thickened wall, possibly due to tumor infiltration, reactive inflammation, or enteritis. Central abdominal abscess is not excluded. Multiple loculated fluid collections. Small right pleural effusion with basilar atelectasis. Diffuse edema.   Electronically Signed   By: Lucienne Capers M.D.   On: 11/13/2014 19:13   Dg Chest 2 View  12/01/2014   CLINICAL DATA:  Shortness of breath, leg swelling  EXAM: CHEST  2 VIEW  COMPARISON:  None.  FINDINGS: Cardiomediastinal silhouette is unremarkable. Mild degenerative changes thoracic spine. There is elevation of the right hemidiaphragm. No acute infiltrate or pulmonary edema. Mild deformity of proximal shaft of the right humerus question prior fracture.  IMPRESSION: No active disease. There is elevation of the right hemidiaphragm. Mild degenerative changes thoracic spine. Question prior fracture proximal shaft of right humerus.   Electronically Signed   By: Lahoma Crocker M.D.   On: 11/09/2014 16:18     EKG Interpretation   Date/Time:  Thursday December 02 2014 14:56:57  EST Ventricular Rate:  99 PR Interval:  116 QRS Duration: 107 QT Interval:  392 QTC Calculation: 503 R Axis:   30 Text Interpretation:  Sinus rhythm Inferior infarct, age indeterminate  Consider anterolateral infarct Prolonged QT interval No previous tracing  Confirmed by Maryan Rued  MD, Loree Fee ([id number]) on 11/13/2014 3:05:19 PM      MDM   Final diagnoses:  Leg swelling  SOB (shortness of breath)  Abdominal pain    Patient here with anasarca, bilateral pitting edema noted to patient's lower and upper extremities. Patient does have diffuse abdominal tenderness with distention of her abdomen. Will follow-up with CT abdomen pelvis. Patient states she is comfortable, declines pain medicine or nausea medication.  Labs remarkable for leukocytosis of 29.7, lactic acid of 2.85. BUN and creatinine elevated, unsure of patient's  baseline, we will perform CT abdomen pelvis without contrast, and oral contrast only. Patient is to be hypokalemic at 2.5, this was repleted with IV potassium. Albumin and protein low, patient's vitals consistent with dehydration, likely patient's fluid volume is third spaced, we'll replete with fluids.  CT abdomen pelvis with impression: Examination is technically limited without IV contrast material and oral contrast material. 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 Small bowel are mildly dilated with diffusely thickened wall, possibly due to tumor infiltration, reactive inflammation, or enteritis. Central abdominal abscess is not excluded. Multiple loculated fluid collections. Small right pleural effusion with basilar atelectasis. Diffuse edema.  Gen. surgery consulted, Dr. Lucia Gaskins to see pt and evaluate  Patient admitted to medicine under Dr. Jani Gravel for anasarca, and pelvic masses.  BP 152/77 mmHg  Pulse 101  Temp(Src) 97.7 F (36.5 C) (Oral)  Resp 22  SpO2  96%  Signed,  Dahlia Bailiff, PA-C 12:38 AM  Patient seen and discussed with Dr. Blanchie Dessert, MD  Carrie Mew, PA-C 11/13/2014 [id number]  Blanchie Dessert, MD 12/05/14 1536

## 2014-12-02 NOTE — ED Notes (Signed)
Attempted to call for report, receiving nurse to call back once available.

## 2014-12-02 NOTE — ED Notes (Signed)
Hospitalist and Education officer, environmental at bedside.

## 2014-12-02 NOTE — ED Notes (Signed)
Patient transported to CT 

## 2014-12-03 ENCOUNTER — Inpatient Hospital Stay (HOSPITAL_COMMUNITY): Payer: Commercial Managed Care - HMO

## 2014-12-03 ENCOUNTER — Encounter (HOSPITAL_COMMUNITY): Payer: Self-pay | Admitting: Radiology

## 2014-12-03 ENCOUNTER — Encounter (HOSPITAL_COMMUNITY): Admission: EM | Disposition: E | Payer: Self-pay | Source: Home / Self Care | Attending: Pulmonary Disease

## 2014-12-03 ENCOUNTER — Encounter (HOSPITAL_COMMUNITY): Payer: Self-pay | Admitting: Anesthesiology

## 2014-12-03 DIAGNOSIS — R1900 Intra-abdominal and pelvic swelling, mass and lump, unspecified site: Secondary | ICD-10-CM | POA: Insufficient documentation

## 2014-12-03 DIAGNOSIS — A419 Sepsis, unspecified organism: Secondary | ICD-10-CM

## 2014-12-03 DIAGNOSIS — E43 Unspecified severe protein-calorie malnutrition: Secondary | ICD-10-CM | POA: Diagnosis present

## 2014-12-03 DIAGNOSIS — K630 Abscess of intestine: Secondary | ICD-10-CM | POA: Insufficient documentation

## 2014-12-03 DIAGNOSIS — R6521 Severe sepsis with septic shock: Secondary | ICD-10-CM

## 2014-12-03 DIAGNOSIS — N839 Noninflammatory disorder of ovary, fallopian tube and broad ligament, unspecified: Secondary | ICD-10-CM

## 2014-12-03 DIAGNOSIS — K631 Perforation of intestine (nontraumatic): Secondary | ICD-10-CM | POA: Diagnosis present

## 2014-12-03 DIAGNOSIS — M7989 Other specified soft tissue disorders: Secondary | ICD-10-CM

## 2014-12-03 DIAGNOSIS — R601 Generalized edema: Secondary | ICD-10-CM

## 2014-12-03 LAB — COMPREHENSIVE METABOLIC PANEL
ALBUMIN: 1.5 g/dL — AB (ref 3.5–5.2)
ALK PHOS: 100 U/L (ref 39–117)
ALK PHOS: 80 U/L (ref 39–117)
ALT: 12 U/L (ref 0–35)
ALT: 12 U/L (ref 0–35)
ANION GAP: 16 — AB (ref 5–15)
AST: 19 U/L (ref 0–37)
AST: 22 U/L (ref 0–37)
Albumin: 1.5 g/dL — ABNORMAL LOW (ref 3.5–5.2)
Anion gap: 8 (ref 5–15)
BILIRUBIN TOTAL: 1.3 mg/dL — AB (ref 0.3–1.2)
BUN: 37 mg/dL — AB (ref 6–23)
BUN: 38 mg/dL — ABNORMAL HIGH (ref 6–23)
CHLORIDE: 104 mmol/L (ref 96–112)
CO2: 23 mmol/L (ref 19–32)
CO2: 21 mmol/L (ref 19–32)
Calcium: 6.9 mg/dL — ABNORMAL LOW (ref 8.4–10.5)
Calcium: 6.2 mg/dL — CL (ref 8.4–10.5)
Chloride: 97 mmol/L (ref 96–112)
Creatinine, Ser: 1.2 mg/dL — ABNORMAL HIGH (ref 0.50–1.10)
Creatinine, Ser: 1.16 mg/dL — ABNORMAL HIGH (ref 0.50–1.10)
GFR calc Af Amer: 52 mL/min — ABNORMAL LOW (ref >90)
GFR calc Af Amer: 54 mL/min — ABNORMAL LOW (ref >90)
GFR calc non Af Amer: 47 mL/min — ABNORMAL LOW (ref >90)
GFR, EST NON AFRICAN AMERICAN: 45 mL/min — AB (ref >90)
GLUCOSE: 87 mg/dL (ref 70–99)
Glucose, Bld: 75 mg/dL (ref 70–99)
POTASSIUM: 3.7 mmol/L (ref 3.5–5.1)
Potassium: 3.3 mmol/L — ABNORMAL LOW (ref 3.5–5.1)
SODIUM: 133 mmol/L — AB (ref 135–145)
Sodium: 136 mmol/L (ref 135–145)
TOTAL PROTEIN: 5.2 g/dL — AB (ref 6.0–8.3)
Total Bilirubin: 1.4 mg/dL — ABNORMAL HIGH (ref 0.3–1.2)
Total Protein: 4.2 g/dL — ABNORMAL LOW (ref 6.0–8.3)

## 2014-12-03 LAB — IRON AND TIBC
Iron: <10 ug/dL — ABNORMAL LOW (ref 42–145)
UIBC: 133 ug/dL (ref 125–400)

## 2014-12-03 LAB — CBC WITH DIFFERENTIAL/PLATELET
BASOS ABS: 0 10*3/uL (ref 0.0–0.1)
BASOS PCT: 0 % (ref 0–1)
Basophils Absolute: 0 10*3/uL (ref 0.0–0.1)
Basophils Relative: 0 % (ref 0–1)
EOS ABS: 0 10*3/uL (ref 0.0–0.7)
Eosinophils Absolute: 0 10*3/uL (ref 0.0–0.7)
Eosinophils Relative: 0 % (ref 0–5)
Eosinophils Relative: 0 % (ref 0–5)
HCT: 25.1 % — ABNORMAL LOW (ref 36.0–46.0)
HCT: 25 % — ABNORMAL LOW (ref 36.0–46.0)
Hemoglobin: 8.3 g/dL — ABNORMAL LOW (ref 12.0–15.0)
Hemoglobin: 8.4 g/dL — ABNORMAL LOW (ref 12.0–15.0)
LYMPHS ABS: 0.7 10*3/uL (ref 0.7–4.0)
Lymphocytes Relative: 3 % — ABNORMAL LOW (ref 12–46)
Lymphocytes Relative: 4 % — ABNORMAL LOW (ref 12–46)
Lymphs Abs: 0.9 10*3/uL (ref 0.7–4.0)
MCH: 26.2 pg (ref 26.0–34.0)
MCH: 26.4 pg (ref 26.0–34.0)
MCHC: 33.1 g/dL (ref 30.0–36.0)
MCHC: 33.6 g/dL (ref 30.0–36.0)
MCV: 79.2 fL (ref 78.0–100.0)
MCV: 78.6 fL (ref 78.0–100.0)
MONO ABS: 0.3 10*3/uL (ref 0.1–1.0)
MONO ABS: 0.2 10*3/uL (ref 0.1–1.0)
Monocytes Relative: 1 % — ABNORMAL LOW (ref 3–12)
Monocytes Relative: 1 % — ABNORMAL LOW (ref 3–12)
NEUTROS PCT: 95 % — AB (ref 43–77)
Neutro Abs: 30.3 10*3/uL — ABNORMAL HIGH (ref 1.7–7.7)
Neutro Abs: 17.6 10*3/uL — ABNORMAL HIGH (ref 1.7–7.7)
Neutrophils Relative %: 96 % — ABNORMAL HIGH (ref 43–77)
Platelets: 388 10*3/uL (ref 150–400)
Platelets: 278 10*3/uL (ref 150–400)
RBC: 3.17 MIL/uL — ABNORMAL LOW (ref 3.87–5.11)
RBC: 3.18 MIL/uL — ABNORMAL LOW (ref 3.87–5.11)
RDW: 18.7 % — AB (ref 11.5–15.5)
RDW: 18.3 % — AB (ref 11.5–15.5)
WBC MORPHOLOGY: INCREASED
WBC: 31.5 10*3/uL — AB (ref 4.0–10.5)
WBC: 18.5 10*3/uL — ABNORMAL HIGH (ref 4.0–10.5)

## 2014-12-03 LAB — LACTATE DEHYDROGENASE: LDH: 479 U/L — AB (ref 94–250)

## 2014-12-03 LAB — SURGICAL PCR SCREEN
MRSA, PCR: NEGATIVE
Staphylococcus aureus: NEGATIVE

## 2014-12-03 LAB — PROTIME-INR
INR: 1.25 (ref 0.00–1.49)
INR: 1.34 (ref 0.00–1.49)
Prothrombin Time: 15.9 seconds — ABNORMAL HIGH (ref 11.6–15.2)
Prothrombin Time: 16.7 seconds — ABNORMAL HIGH (ref 11.6–15.2)

## 2014-12-03 LAB — TROPONIN I: Troponin I: <0.03 ng/mL (ref <0.031)

## 2014-12-03 LAB — SEDIMENTATION RATE: Sed Rate: 38 mm/hr — ABNORMAL HIGH (ref 0–22)

## 2014-12-03 LAB — APTT
aPTT: 36 seconds (ref 24–37)
aPTT: 36 seconds (ref 24–37)

## 2014-12-03 LAB — MRSA PCR SCREENING: MRSA by PCR: NEGATIVE

## 2014-12-03 LAB — MAGNESIUM: MAGNESIUM: 1.7 mg/dL (ref 1.5–2.5)

## 2014-12-03 LAB — PROCALCITONIN: Procalcitonin: 23.62 ng/mL

## 2014-12-03 LAB — FIBRINOGEN: Fibrinogen: 291 mg/dL (ref 204–475)

## 2014-12-03 LAB — LACTIC ACID, PLASMA: LACTIC ACID, VENOUS: 2.3 mmol/L — AB (ref 0.5–2.0)

## 2014-12-03 LAB — VITAMIN B12: Vitamin B-12: 1457 pg/mL — ABNORMAL HIGH (ref 211–911)

## 2014-12-03 LAB — FERRITIN: FERRITIN: 374 ng/mL — AB (ref 10–291)

## 2014-12-03 LAB — D-DIMER, QUANTITATIVE: D-Dimer, Quant: 13.56 ug/mL-FEU — ABNORMAL HIGH (ref 0.00–0.48)

## 2014-12-03 SURGERY — LAPAROTOMY, EXPLORATORY
Anesthesia: General

## 2014-12-03 MED ORDER — SODIUM CHLORIDE 0.9 % IV BOLUS (SEPSIS)
1000.0000 mL | Freq: Once | INTRAVENOUS | Status: AC
  Administered 2014-12-03: 1000 mL via INTRAVENOUS

## 2014-12-03 MED ORDER — FAMOTIDINE IN NACL 20-0.9 MG/50ML-% IV SOLN
20.0000 mg | INTRAVENOUS | Status: DC
  Administered 2014-12-03 – 2014-12-06 (×3): 20 mg via INTRAVENOUS
  Filled 2014-12-03 (×3): qty 50

## 2014-12-03 MED ORDER — PIPERACILLIN-TAZOBACTAM 3.375 G IVPB 30 MIN
3.3750 g | Freq: Once | INTRAVENOUS | Status: DC
Start: 2014-12-03 — End: 2014-12-03

## 2014-12-03 MED ORDER — IOHEXOL 300 MG/ML  SOLN
25.0000 mL | INTRAMUSCULAR | Status: AC
  Administered 2014-12-03 (×2): 25 mL via ORAL

## 2014-12-03 MED ORDER — ALBUMIN HUMAN 25 % IV SOLN
25.0000 g | Freq: Once | INTRAVENOUS | Status: AC
  Administered 2014-12-03: 25 g via INTRAVENOUS
  Filled 2014-12-03: qty 50

## 2014-12-03 MED ORDER — PIPERACILLIN-TAZOBACTAM 3.375 G IVPB
3.3750 g | Freq: Three times a day (TID) | INTRAVENOUS | Status: DC
  Administered 2014-12-03 – 2014-12-11 (×23): 3.375 g via INTRAVENOUS
  Filled 2014-12-03 (×29): qty 50

## 2014-12-03 MED ORDER — IOHEXOL 300 MG/ML  SOLN
80.0000 mL | Freq: Once | INTRAMUSCULAR | Status: AC | PRN
  Administered 2014-12-03: 80 mL via INTRAVENOUS

## 2014-12-03 MED ORDER — DEXTROSE 5 % IV SOLN
0.0000 ug/min | INTRAVENOUS | Status: DC
  Administered 2014-12-03: 20 ug/min via INTRAVENOUS
  Administered 2014-12-03: 5 ug/min via INTRAVENOUS
  Administered 2014-12-04: 20 ug/min via INTRAVENOUS
  Administered 2014-12-05: 8 ug/min via INTRAVENOUS
  Administered 2014-12-05: 12.5 ug/min via INTRAVENOUS
  Administered 2014-12-05: 8 ug/min via INTRAVENOUS
  Administered 2014-12-05: 10 ug/min via INTRAVENOUS
  Administered 2014-12-06 (×5): 18 ug/min via INTRAVENOUS
  Administered 2014-12-07 (×2): 24 ug/min via INTRAVENOUS
  Administered 2014-12-07: 23 ug/min via INTRAVENOUS
  Administered 2014-12-07 (×2): 26 ug/min via INTRAVENOUS
  Administered 2014-12-07: 25 ug/min via INTRAVENOUS
  Administered 2014-12-07: 24 ug/min via INTRAVENOUS
  Filled 2014-12-03 (×23): qty 4

## 2014-12-03 MED ORDER — HEPARIN SODIUM (PORCINE) 5000 UNIT/ML IJ SOLN
5000.0000 [IU] | Freq: Three times a day (TID) | INTRAMUSCULAR | Status: DC
  Administered 2014-12-03 – 2014-12-05 (×4): 5000 [IU] via SUBCUTANEOUS
  Filled 2014-12-03 (×4): qty 1

## 2014-12-03 MED ORDER — ONDANSETRON HCL 4 MG/2ML IJ SOLN: 4.0000 mg | Freq: Four times a day (QID) | INTRAMUSCULAR | Status: DC | PRN

## 2014-12-03 MED ORDER — SODIUM CHLORIDE 0.9 % IV BOLUS (SEPSIS)
1000.0000 mL | INTRAVENOUS | Status: DC
  Administered 2014-12-03: 1000 mL via INTRAVENOUS

## 2014-12-03 MED ORDER — SODIUM CHLORIDE 0.9 % IV SOLN: 250.0000 mL | INTRAVENOUS | Status: DC | PRN

## 2014-12-03 MED ORDER — PIPERACILLIN-TAZOBACTAM 3.375 G IVPB 30 MIN: 3.3750 g | Freq: Once | INTRAVENOUS | Status: DC

## 2014-12-03 MED ORDER — VANCOMYCIN HCL IN DEXTROSE 1-5 GM/200ML-% IV SOLN
1000.0000 mg | Freq: Every day | INTRAVENOUS | Status: DC
  Administered 2014-12-03 – 2014-12-08 (×6): 1000 mg via INTRAVENOUS
  Filled 2014-12-03 (×5): qty 200

## 2014-12-03 MED ORDER — FAMOTIDINE IN NACL 20-0.9 MG/50ML-% IV SOLN: 20.0000 mg | Freq: Two times a day (BID) | INTRAVENOUS | Status: DC

## 2014-12-03 MED ORDER — SODIUM CHLORIDE 0.9 % IV BOLUS (SEPSIS)
500.0000 mL | Freq: Once | INTRAVENOUS | Status: AC
  Administered 2014-12-03: 500 mL via INTRAVENOUS

## 2014-12-03 MED ORDER — POTASSIUM CHLORIDE 10 MEQ/100ML IV SOLN
10.0000 meq | INTRAVENOUS | Status: AC
  Administered 2014-12-03 (×4): 10 meq via INTRAVENOUS
  Filled 2014-12-03 (×4): qty 100

## 2014-12-03 MED ORDER — SODIUM CHLORIDE 0.9 % IV SOLN
1.0000 g | Freq: Once | INTRAVENOUS | Status: AC
  Administered 2014-12-03: 1 g via INTRAVENOUS
  Filled 2014-12-03: qty 10

## 2014-12-03 MED ORDER — SODIUM CHLORIDE 0.9 % IV SOLN
1.0000 g | INTRAVENOUS | Status: DC
  Filled 2014-12-03: qty 1

## 2014-12-03 NOTE — Progress Notes (Signed)
ANTIBIOTIC CONSULT NOTE - INITIAL  Pharmacy Consult for Zosyn/Vancomycin Indication: Intra-abdominal infection/abscess/Sepsis  No Known Allergies  Patient Measurements: Height: 5\' 2"  (157.5 cm) Weight: 145 lb (65.772 kg) IBW/kg (Calculated) : 50.1 Wt=  Vital Signs: Temp: 97.2 F (36.2 C) (02/26 2027) Temp Source: Oral (02/26 2027) BP: 125/67 mmHg (02/26 1533) Pulse Rate: 116 (02/26 1533) Intake/Output from previous day: 02/25 0701 - 02/26 0700 In: 1656.7 [I.V.:1656.7] Out: -  Intake/Output from this shift:    Labs:  Recent Labs  11/24/2014 1601 11/29/2014 0555 11/13/2014 2155  WBC 29.7* 31.5* 18.5*  HGB 9.3* 8.3* 8.4*  PLT 480* 388 PENDING  CREATININE 1.53* 1.20*  --    Estimated Creatinine Clearance: 38.8 mL/min (by C-G formula based on Cr of 1.2). No results for input(s): VANCOTROUGH, VANCOPEAK, VANCORANDOM, GENTTROUGH, GENTPEAK, GENTRANDOM, TOBRATROUGH, TOBRAPEAK, TOBRARND, AMIKACINPEAK, AMIKACINTROU, AMIKACIN in the last 72 hours.   Microbiology: Recent Results (from the past 720 hour(s))  Surgical pcr screen     Status: None   Collection Time: 11/21/2014  5:18 PM  Result Value Ref Range Status   MRSA, PCR NEGATIVE NEGATIVE Final   Staphylococcus aureus NEGATIVE NEGATIVE Final    Comment:        The Xpert SA Assay (FDA approved for NASAL specimens in patients over 31 years of age), is one component of a comprehensive surveillance program.  Test performance has been validated by [facility] for patients greater than or equal to 13 year old. It is not intended to diagnose infection nor to guide or monitor treatment.   MRSA PCR Screening     Status: None   Collection Time: 11/12/2014  8:39 PM  Result Value Ref Range Status   MRSA by PCR NEGATIVE NEGATIVE Final    Comment:        The GeneXpert MRSA Assay (FDA approved for NASAL specimens only), is one component of a comprehensive MRSA colonization surveillance program. It is not intended to diagnose  MRSA infection nor to guide or monitor treatment for MRSA infections.     Medical History: Past Medical History  Diagnosis Date  . Irritable bowel      Assessment: 69 yoF c/o weakness and slight abdominal distention.  Zosyn and Vancomycin per Rx for intra-abdominal infection/abcess/Sepsis  Goal of Therapy:  Treat infection  Plan:   Zosyn 3.375 Gm IV q8h   Vancomycin 1gm IV q24h  F/u SCr/cultures/levels as needed  Lawana Pai R 12/04/2014,10:27 PM

## 2014-12-03 NOTE — Progress Notes (Signed)
[place] Progress Note Patient Name: Nicole Cannon DOB: [birth year] MRN: [id number]   Date of Service  11/29/2014  HPI/Events of Note  Hypocalcemia despite correction for low albumin.    eICU Interventions  Plan: Calcium replaced     Intervention Category Intermediate Interventions: Electrolyte abnormality - evaluation and management  Gemini Beaumier 12/02/2014, 11:00 PM

## 2014-12-03 NOTE — Progress Notes (Signed)
Called by RN at 6:37 pm after patient became orthostatic when being helped to the bathroom.  Fluid bolus ordered.  RN called back at 6:43 pm reporting the patient had rales and requested transfer to the ICU.  Transfer orders given.  Upon arrival to ICU, BP became unstable.  Surgery at bedside.  PCCM consulted for probable severe sepsis given colonic perforation in the setting of probable metastatic cancer.    Levoy Geisen 11/08/2014 7:26 PM

## 2014-12-03 NOTE — Progress Notes (Signed)
Subjective: Pain in abdomen when she moves  Objective: Vital signs in last 24 hours: Temp:  [97 F (36.1 C)-97.7 F (36.5 C)] 97.3 F (36.3 C) (02/26 0519) Pulse Rate:  [88-101] 91 (02/26 0519) Resp:  [18-36] 27 (02/26 0519) BP: (112-152)/(56-77) 112/70 mmHg (02/26 0519) SpO2:  [94 %-99 %] 95 % (02/26 0519) Weight:  [145 lb (65.772 kg)] 145 lb (65.772 kg) (02/26 0144) Last BM Date:  (a couple days ago per pt)  Intake/Output from previous day: 02/25 0701 - 02/26 0700 In: 1656.7 [I.V.:1656.7] Out: -  Intake/Output this shift:    PE: General- In NAD Abdomen-firm, hard mass in lower abdomen  Lab Results:   Recent Labs  11/16/2014 1601 11/25/2014 0555  WBC 29.7* 31.5*  HGB 9.3* 8.3*  HCT 27.3* 25.1*  PLT 480* 388   BMET  Recent Labs  12/01/2014 1601 11/09/2014 0555  NA 132* 136  K 2.5* 3.3*  CL 95* 97  CO2 24 23  GLUCOSE 134* 87  BUN 33* 37*  CREATININE 1.53* 1.20*  CALCIUM 6.8* 6.9*   PT/INR  Recent Labs  11/25/2014 0555  LABPROT 15.9*  INR 1.25   Comprehensive Metabolic Panel:    Component Value Date/Time   NA 136 11/19/2014 0555   NA 132* 11/30/2014 1601   K 3.3* 11/15/2014 0555   K 2.5* 11/14/2014 1601   CL 97 12/01/2014 0555   CL 95* 12/05/2014 1601   CO2 23 11/15/2014 0555   CO2 24 11/28/2014 1601   BUN 37* 11/15/2014 0555   BUN 33* 11/15/2014 1601   CREATININE 1.20* 11/15/2014 0555   CREATININE 1.53* 11/22/2014 1601   GLUCOSE 87 11/13/2014 0555   GLUCOSE 134* 11/30/2014 1601   CALCIUM 6.9* 11/13/2014 0555   CALCIUM 6.8* 11/26/2014 1601   AST 19 11/14/2014 0555   AST 21 11/30/2014 1601   ALT 12 12/05/2014 0555   ALT 14 11/08/2014 1601   ALKPHOS 100 11/12/2014 0555   ALKPHOS 87 11/17/2014 1601   BILITOT 1.4* 11/10/2014 0555   BILITOT 1.4* 11/26/2014 1601   PROT 5.2* 12/05/2014 0555   PROT 5.7* 11/18/2014 1601   ALBUMIN 1.5* 12/03/2014 0555   ALBUMIN 1.8* 11/22/2014 1601     Studies/Results: Ct Abdomen Pelvis Wo  Contrast  12/05/2014   CLINICAL DATA:  Edema in the lower extremities beginning about 3 weeks ago and progressively worsening. Weakness beginning this week worsening. Diffuse abdominal pain and nausea for 2 weeks.  EXAM: CT ABDOMEN AND PELVIS WITHOUT CONTRAST  TECHNIQUE: Multidetector CT imaging of the abdomen and pelvis was performed following the standard protocol without IV contrast.  COMPARISON:  None.  FINDINGS: Small right pleural effusion with basilar atelectasis. Small pericardial effusion. Mild cardiac enlargement.  Evaluation of solid organs and vascular structures is limited without IV contrast material. Evaluation of bowel is limited without oral contrast. Loculated appearing fluid collections are demonstrated in the right upper quadrant posterior to the liver and extending inferior to the liver along the right lateral abdominal and pelvic wall. Additional fluid collections are suggested in the mesenteric. There is an air-fluid level in the central mesentery to the left which could represent a focal dilated bowel loop or an abscess. Loculated hyperdense fluid collections versus mass lesions in the pelvis. Nodular lesions in the mesentery. No free air. Small bowel are mildly dilated with diffuse wall thickening. Bladder is decompressed with mild wall thickening. I believe that the changes with most likely represent pelvic masses with diffuse peritoneal carcinomatosis and associated  fluid collections. Abscess or inflammatory process are not excluded. Diffuse enteritis is not excluded. Repeat imaging with oral and IV contrast material would be helpful to better define the abnormalities if the patient can't tolerate contrast.  Tiny subcentimeter lesion in the right lobe of the liver is indeterminate but probably represents a cyst. Gallbladder is mildly contracted. No stones or wall thickening. No bile duct dilatation. Unenhanced appearance of the spleen, pancreas, adrenal glands, kidneys, abdominal aorta,  and inferior vena cava is unremarkable. Scattered retroperitoneal and mesenteric lymph nodes are not pathologically enlarged. No free air in the abdomen.  Pelvis: See above. 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 No destructive bone lesions appreciated. Diffuse soft tissue edema.  IMPRESSION: Examination is technically limited without IV contrast material and oral contrast material. 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 Small bowel are mildly dilated with diffusely thickened wall, possibly due to tumor infiltration, reactive inflammation, or enteritis. Central abdominal abscess is not excluded. Multiple loculated fluid collections. Small right pleural effusion with basilar atelectasis. Diffuse edema.   Electronically Signed   By: Lucienne Capers M.D.   On: 11/13/2014 19:13   Dg Chest 2 View  11/22/2014   CLINICAL DATA:  Shortness of breath, leg swelling  EXAM: CHEST  2 VIEW  COMPARISON:  None.  FINDINGS: Cardiomediastinal silhouette is unremarkable. Mild degenerative changes thoracic spine. There is elevation of the right hemidiaphragm. No acute infiltrate or pulmonary edema. Mild deformity of proximal shaft of the right humerus question prior fracture.  IMPRESSION: No active disease. There is elevation of the right hemidiaphragm. Mild degenerative changes thoracic spine. Question prior fracture proximal shaft of right humerus.   Electronically Signed   By: Lahoma Crocker M.D.   On: 11/21/2014 16:18    Anti-infectives: Anti-infectives    Start     Dose/Rate Route Frequency Ordered Stop   11/10/2014 0400  piperacillin-tazobactam (ZOSYN) IVPB 3.375 g     3.375 g 12.5 mL/hr over 240 Minutes Intravenous Every 8 hours 11/24/2014 2337     11/17/2014 2000  piperacillin-tazobactam (ZOSYN) IVPB 3.375 g     3.375 g 12.5 mL/hr over 240 Minutes Intravenous  Once 11/09/2014 1952 11/13/2014 2245       Assessment Abdominal/pelvic masses Multiple fluid collections Both of the above are not well defined on the non contrasted CT    LOS: 1 day   Plan: CT with contrast today.  Further plans based on those results.   Odis Hollingshead 12/05/2014

## 2014-12-03 NOTE — Progress Notes (Signed)
Surgery made aware of CT results via return phone call from Will.

## 2014-12-03 NOTE — Progress Notes (Signed)
INITIAL NUTRITION ASSESSMENT  DOCUMENTATION CODES Per approved criteria  -Severe malnutrition in the context of chronic illness   Pt meets criteria for severe MALNUTRITION in the context of chronic illness as evidenced by less than >75% of PO intake in the past month, moderate to severe fat and muscle wasting and severe fluid accumulation.    INTERVENTION: -Recommend Resource Breeze BID when advanced to diet providing 250 kcal and 9 g protein.  NUTRITION DIAGNOSIS: Inadequate oral intake related to poor appetite as evidenced by abdominal pain for 2-3 months.   Goal: Pt to meet >/= 90% of estimated needs  Monitor:  PO intake when diet advanced, weight trends, labs  Reason for Assessment: Consult per MD  69 y.o. female  Admitting Dx: Pelvic mass  ASSESSMENT: Pt presents with generalized weakness and swelling in her legs for the past 2 weeks.  Noted to be hypokalemic and have mild renal insufficiency.  CT scan showed pelvic mass ? Pelvic carcinomatosis?   Pt hx of IBS.    Pt reports 50 lbs of weight loss since June.  Pt was 180 lbs and started a weight loss program and lost 35lbs, but the rest of the weight she was not trying to loose.  Pt reports she got down to 135 lbs before the swelling started leaving a 7% unintentional wt loss in the past 2-3 months (not significant for time frame). Pt reports the past 2-3 months she has only been able to eat vegetable soups or miso soups and juice because of abdominal pain.   Pt reports supplements like Ensure make her stomach hurt but willing to try Resource Breeze when diet advanced.  Nutrition Focused Physical Exam:  Subcutaneous Fat:  Orbital Region: moderate depletion Upper Arm Region: moderate depletion Thoracic and Lumbar Region: n/a  Muscle:  Temple Region: severe depletion Clavicle Bone Region: severe depletion Clavicle and Acromion Bone Region: severe depletion Scapular Bone Region: n/a Dorsal Hand: n/a Patellar Region:  n/a Anterior Thigh Region: n/a Posterior Calf Region: n/a  Edema: severe edema of arms and legs    Height: Ht Readings from Last 1 Encounters:  11/29/2014 5\' 2"  (1.575 m)    Weight: Wt Readings from Last 1 Encounters:  11/23/2014 145 lb (65.772 kg)    Ideal Body Weight: 110 lbs  % Ideal Body Weight: 132%  Wt Readings from Last 10 Encounters:  11/08/2014 145 lb (65.772 kg)    Usual Body Weight: 180 lbs   % Usual Body Weight: 80%  BMI:  Body mass index is 26.51 kg/(m^2).  Estimated Nutritional Needs: Kcal: 1500-1700 kcal Protein: 80-95 g protein Fluid: >/= 1.9 L/day  Skin: WDL  Diet Order: Diet NPO time specified  EDUCATION NEEDS: -No education needs identified at this time   Intake/Output Summary (Last 24 hours) at 11/08/2014 1007 Last data filed at 11/08/2014 0700  Gross per 24 hour  Intake 1656.67 ml  Output      0 ml  Net 1656.67 ml    Last BM: PTA    Labs:   Recent Labs Lab 11/15/2014 1601 11/20/2014 0555  NA 132* 136  K 2.5* 3.3*  CL 95* 97  CO2 24 23  BUN 33* 37*  CREATININE 1.53* 1.20*  CALCIUM 6.8* 6.9*  MG  --  1.7  GLUCOSE 134* 87    CBG (last 3)  No results for input(s): GLUCAP in the last 72 hours.  Scheduled Meds: . piperacillin-tazobactam (ZOSYN)  IV  3.375 g Intravenous Q8H  . potassium chloride  10 mEq Intravenous Q1 Hr x 4  . sodium chloride  3 mL Intravenous Q12H    Continuous Infusions: . 0.9 % NaCl with KCl 40 mEq / L 100 mL/hr (12/04/2014 0126)    Past Medical History  Diagnosis Date  . Irritable bowel     Past Surgical History  Procedure Laterality Date  . Tonsillectomy      Elmer Picker MS Dietetic Intern Pager Number [phone]

## 2014-12-03 NOTE — Progress Notes (Signed)
[facility] PROGRESS NOTE  Nicole Cannon ATF:[id number] DOB: 08-20-1946 DOA: 11/12/2014 PCP: Abigail Miyamoto, MD  Assessment/Plan: #1 severe sepsis with septic shock Likely secondary to intra-abdominal process going on. Concern for possible pelvic mass with probable abscess formation. Will institute a septic shock protocol. IV fluids have been changed to Invanz. Check a lactic acid, pro-calcitonin, CBC with differential, comprehensive metabolic profile, check blood cultures 2. Placed on IV fluids. Will place on her Levothroid drip. Will give some albumin. Critical care has been consulted for further evaluation and management. General surgery following.  #2 pelvic mass/ovarian mass/anasarca Repeat CT scan with results as stated below. General surgery following. IR for possible biopsy. Anasarca likely secondary to malignancy, malnutrition versus lymphatic obstruction. CEA/CEA 125 pending. Will give some albumin. General surgery following.  #3 intra-abdominal abscess Repeat CT scan with a 8 x 8.8 cm abscess noted within the small bowel mesentery to the left of the midline. Patient on IV Invanz. General surgery following. Due to patient's overall condition may need a percutaneous drain.  #4 severe protein calorie malnutrition Albumin of 1.9. Will give some IV albumin. Dietitian has been consulted.  #5 severe iron deficiency anemia Iron level of less than 10. Concern for probable pelvic mass/?? Colonic mass. Hemoglobin currently at 8.3. Chest seizure threshold hemoglobin less than 7. Follow H&H. Will likely need some IV iron once patient is stabilized.   Code Status: Full Family Communication: Updated patient and son at bedside. Disposition Plan: Remain in the ICU.   Consultants:  General surgery: Dr. Lucia Gaskins  PCCM pending  Procedures: Ct Abdomen Pelvis Wo Contrast 11/24/2014: Examination is technically limited without IV contrast material and oral contrast material. 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 Small bowel are mildly dilated with diffusely thickened wall, possibly due to tumor infiltration, reactive inflammation, or enteritis. Central abdominal abscess is not excluded. Multiple loculated fluid collections. Small right pleural effusion with basilar atelectasis. Diffuse edema.   Dg Chest 2 View 11/13/2014: No active disease. There is elevation of the right hemidiaphragm. Mild degenerative changes thoracic spine. Question prior fracture proximal shaft of right humerus.  CT abdomen and pelvis with contrast 11/08/2014  Large approximately 16 cm mass in the lower abdomen and pelvis causing distal colonic obstruction. This is suspicious for primary colon carcinoma. Differential diagnosis also includes uterine carcinoma, ovarian carcinoma, and GI stromal tumor.  9 cm extraluminal gas and fluid collection in small bowel mesentery, consistent with localized abscess secondary to bowel perforation. No free intraperitoneal air identified.  Moderate ascites and peritoneal enhancement, suspicious for diffuse peritoneal carcinomatosis.  Mild retroperitoneal lymphadenopathy in the aortocaval space.  Two small sub-cm indeterminate liver lesions. Early liver metastases cannot definitely be excluded.  Small right pleural effusion and diffuse body wall edema.  Antibiotics:  Zosyn 11/25/2014>>>> 11/22/2014  IV Invanz 11/28/2014  HPI/Subjective: Patient with complaints of some abdominal pain. Patient stated had some emesis after contrasted CT. Patient transferred from regular floor secondary to hypotension and instability.  Objective: Filed Vitals:   11/27/2014 1533  BP: 125/67  Pulse: 116  Temp: 97.4 F (36.3 C)  Resp: 22    Intake/Output Summary (Last 24 hours) at 11/26/2014 1922 Last data filed at 11/27/2014 0700  Gross per 24 hour  Intake 1656.67 ml   Output      0 ml  Net 1656.67 ml   Filed Weights   12/05/2014 0144  Weight: 65.772 kg (145 lb)  Exam:   General:  Clammy, Increased respiratory rate, speaking in full sentences.  Cardiovascular: Tachycardic  Respiratory: Bibasilar crackles. No wheezing.  Abdomen: Distended, hard, hypoactive bowel sounds, diffuse tenderness to palpation.  Musculoskeletal: No clubbing or cyanosis. 3-4+ bilateral lower extremity edema.  Data Reviewed: Basic Metabolic Panel:  Recent Labs Lab 11/12/2014 1601 11/15/2014 0555  NA 132* 136  K 2.5* 3.3*  CL 95* 97  CO2 24 23  GLUCOSE 134* 87  BUN 33* 37*  CREATININE 1.53* 1.20*  CALCIUM 6.8* 6.9*  MG  --  1.7   Liver Function Tests:  Recent Labs Lab 12/01/2014 1601 11/30/2014 0555  AST 21 19  ALT 14 12  ALKPHOS 87 100  BILITOT 1.4* 1.4*  PROT 5.7* 5.2*  ALBUMIN 1.8* 1.5*    Recent Labs Lab 11/08/2014 1601  LIPASE 10*   No results for input(s): AMMONIA in the last 168 hours. CBC:  Recent Labs Lab 12/04/2014 1601 12/05/2014 0555  WBC 29.7* 31.5*  NEUTROABS 28.2* 30.3*  HGB 9.3* 8.3*  HCT 27.3* 25.1*  MCV 78.0 79.2  PLT 480* 388   Cardiac Enzymes:  Recent Labs Lab 11/08/2014 0555  TROPONINI <0.03   BNP (last 3 results)  Recent Labs  11/24/2014 1601  BNP 137.7*    ProBNP (last 3 results) No results for input(s): PROBNP in the last 8760 hours.  CBG: No results for input(s): GLUCAP in the last 168 hours.  No results found for this or any previous visit (from the past 240 hour(s)).   Studies: Ct Abdomen Pelvis Wo Contrast  11/30/2014   CLINICAL DATA:  Edema in the lower extremities beginning about 3 weeks ago and progressively worsening. Weakness beginning this week worsening. Diffuse abdominal pain and nausea for 2 weeks.  EXAM: CT ABDOMEN AND PELVIS WITHOUT CONTRAST  TECHNIQUE: Multidetector CT imaging of the abdomen and pelvis was performed following the standard protocol without IV contrast.  COMPARISON:  None.   FINDINGS: Small right pleural effusion with basilar atelectasis. Small pericardial effusion. Mild cardiac enlargement.  Evaluation of solid organs and vascular structures is limited without IV contrast material. Evaluation of bowel is limited without oral contrast. Loculated appearing fluid collections are demonstrated in the right upper quadrant posterior to the liver and extending inferior to the liver along the right lateral abdominal and pelvic wall. Additional fluid collections are suggested in the mesenteric. There is an air-fluid level in the central mesentery to the left which could represent a focal dilated bowel loop or an abscess. Loculated hyperdense fluid collections versus mass lesions in the pelvis. Nodular lesions in the mesentery. No free air. Small bowel are mildly dilated with diffuse wall thickening. Bladder is decompressed with mild wall thickening. I believe that the changes with most likely represent pelvic masses with diffuse peritoneal carcinomatosis and associated fluid collections. Abscess or inflammatory process are not excluded. Diffuse enteritis is not excluded. Repeat imaging with oral and IV contrast material would be helpful to better define the abnormalities if the patient can't tolerate contrast.  Tiny subcentimeter lesion in the right lobe of the liver is indeterminate but probably represents a cyst. Gallbladder is mildly contracted. No stones or wall thickening. No bile duct dilatation. Unenhanced appearance of the spleen, pancreas, adrenal glands, kidneys, abdominal aorta, and inferior vena cava is unremarkable. Scattered retroperitoneal and mesenteric lymph nodes are not pathologically enlarged. No free air in the abdomen.  Pelvis: See above. 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 No destructive bone lesions appreciated. Diffuse soft tissue  edema.  IMPRESSION: Examination is technically limited without IV contrast material and oral contrast material. 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 Small bowel are mildly dilated with diffusely thickened wall, possibly due to tumor infiltration, reactive inflammation, or enteritis. Central abdominal abscess is not excluded. Multiple loculated fluid collections. Small right pleural effusion with basilar atelectasis. Diffuse edema.   Electronically Signed   By: Lucienne Capers M.D.   On: 11/29/2014 19:13   Dg Chest 2 View  11/21/2014   CLINICAL DATA:  Shortness of breath, leg swelling  EXAM: CHEST  2 VIEW  COMPARISON:  None.  FINDINGS: Cardiomediastinal silhouette is unremarkable. Mild degenerative changes thoracic spine. There is elevation of the right hemidiaphragm. No acute infiltrate or pulmonary edema. Mild deformity of proximal shaft of the right humerus question prior fracture.  IMPRESSION: No active disease. There is elevation of the right hemidiaphragm. Mild degenerative changes thoracic spine. Question prior fracture proximal shaft of right humerus.   Electronically Signed   By: Lahoma Crocker M.D.   On: 12/05/2014 16:18   Ct Abdomen Pelvis W Contrast  12/03/2014   CLINICAL DATA:  Abdominal mass. Abdominal distention. 50 lb weight loss.  EXAM: CT ABDOMEN AND PELVIS WITH CONTRAST  TECHNIQUE: Multidetector CT imaging of the abdomen and pelvis was performed using the standard protocol following bolus administration of intravenous contrast.  CONTRAST:  55mL OMNIPAQUE IOHEXOL 300 MG/ML  SOLN  COMPARISON:  Noncontrast CT on 11/10/2014  FINDINGS: Lower Chest: Small right pleural effusion and right lower lobe atelectasis noted.  Hepatobiliary: 2 tiny less than 1 cm low-attenuation lesions are seen in the right and left hepatic lobes which are too small to characterize. No other liver lesions are identified. Gallbladder is unremarkable.  Pancreas: No mass, inflammatory changes, or other significant abnormality identified.  Spleen:  Within  normal limits in size and appearance.  Adrenals:  No masses identified.  Kidneys/Urinary Tract:  No evidence of masses or hydronephrosis.  Stomach/Bowel/Peritoneum: Mild ascites and diffuse body wall edema are noted. Mild peritoneal enhancement seen surrounding the liver and in the lower abdomen suspicious for peritoneal carcinomatosis. Mild dilatation of small bowel loops and colon is seen, with transition point near the sigmoid colon.  A large heterogeneously enhancing mass is seen within the right lower quadrant of the abdomen and central pelvis which measures approximately 11.0 x 15.6 cm. This contains some internal gas and appears to be centered on the sigmoid colon. This could also arise from the uterus if patient has not undergone previous hysterectomy, or potentially the ovaries or mesentery.  An extraluminal collection containing both fluid and gas is seen within the small bowel mesentery to the left of midline which measures approximately 8.0 x 8.8 cm on image 55/series 2. This is consistent with abscess secondary to bowel perforation.  Vascular/Lymphatic: Retroperitoneal lymphadenopathy seen in aorto-caval space measuring 2.4 cm on image 48/series 2. No other definite lymphadenopathy identified.  Reproductive: Not well visualized due to large pelvic mass described above.  Other:  None.  Musculoskeletal:  No suspicious bone lesions identified.  IMPRESSION: Large approximately 16 cm mass in the lower abdomen and pelvis causing distal colonic obstruction. This is suspicious for primary colon carcinoma. Differential diagnosis also includes uterine carcinoma, ovarian carcinoma, and GI stromal tumor.  9 cm extraluminal gas and fluid collection in small bowel mesentery, consistent with localized abscess secondary to bowel perforation. No free intraperitoneal air identified.  Moderate ascites  and peritoneal enhancement, suspicious for diffuse peritoneal carcinomatosis.  Mild retroperitoneal lymphadenopathy in the  aortocaval space.  Two small sub-cm indeterminate liver lesions. Early liver metastases cannot definitely be excluded.  Small right pleural effusion and diffuse body wall edema.  Critical Value/emergent results were called by telephone at the time of interpretation on 12/01/2014 at 2:55 pm to the patient's floor nurse Gwenn, who verbally acknowledged these results.   Electronically Signed   By: Earle Gell M.D.   On: 11/12/2014 14:48    Scheduled Meds: . albumin human  25 g Intravenous Once  . ertapenem  1 g Intravenous Q24H  . sodium chloride  1,000 mL Intravenous Once  . sodium chloride  1,000 mL Intravenous Q1H  . sodium chloride  500 mL Intravenous Once  . sodium chloride  3 mL Intravenous Q12H   Continuous Infusions: . 0.9 % NaCl with KCl 40 mEq / L 100 mL/hr (11/13/2014 0126)  . norepinephrine (LEVOPHED) Adult infusion      Principal Problem:   Severe sepsis with septic shock Active Problems:   Pelvic mass   Anasarca   Ovarian mass   Hypokalemia   Renal insufficiency   Protein-calorie malnutrition, severe    Time spent: 29 mins    [facility] MD [facility] Pager [phone]. If 7PM-7AM, please contact night-coverage at [email or web address], password [facility] 11/18/2014, 7:22 PM  LOS: 1 day

## 2014-12-03 NOTE — Progress Notes (Signed)
eLink Physician-Brief Progress Note Patient Name: Nicole Cannon DOB: [birth year] MRN: [id number]   Date of Service  11/17/2014  HPI/Events of Note  Patient admitted to ICU with concern for sepsis - now with intermittent hypotension.  Has received 2 liters of NS since admit to ICU.  Not voided since 7pm.  eICU Interventions  Plan: Bladder scan times one     Intervention Category Intermediate Interventions: Oliguria - evaluation and management  DETERDING,ELIZABETH 11/29/2014, 10:19 PM

## 2014-12-03 NOTE — H&P (Signed)
PULMONARY / CRITICAL CARE MEDICINE HISTORY AND PHYSICAL EXAMINATION   Name: Nicole Cannon MRN: [id number] DOB: 1946-05-09    ADMISSION DATE:  11/08/2014  PRIMARY SERVICE: PCCM  CHIEF COMPLAINT:  Weakness, abdominal distension  BRIEF PATIENT DESCRIPTION: 69 year old woman with an abdominal mass, malnutrition, and septic shock.  SIGNIFICANT EVENTS / STUDIES:  Developed worsening SIRS as well as symptomatic hypotension on the day of transfer. Discussed surgical options with general surgery and their consensus was that in her current state, emergent surgery would NOT be in her best interest.  LINES / TUBES: Left Lee CVC - 11/15/2014 PIV x2  CULTURES: Blood - 2/26 - pending  ANTIBIOTICS: Pip-tazo: 2/25- Vanc: 2/26-  HISTORY OF PRESENT ILLNESS:   Nicole Cannon is a 69 year old woman with no significant PMHx who presented to the hospital with progressive weakness and fatigue. She had developed abdominal discomfort, bloating, peripheral edema as well as colitis-like symptoms for several months, and sought out alternative medical therapies (diet-based). She has had about a 50-pound weight loss in recent months. She has had no colonoscopy performed. She presented to the hospital after neighbors expressed their concern, and a CT was performed which demonstrated a 16 cm mass in her lower abdomen with distal colonic obstruction. There was also moderate ascites and peritoneal enhancement suspicious for peritoneal carcinomatosis. No definite origin on the mass was identified. General surgery was consulted, but before a definite plan could be put into place, the patient developed worsening sepsis. Emergent surgery for source control was considered, but ultimately declined given her poor nutritional state, etc.   PAST MEDICAL HISTORY :  Past Medical History  Diagnosis Date  . Irritable bowel    Past Surgical History  Procedure Laterality Date  . Tonsillectomy     Prior to Admission medications   Not  on File   No Known Allergies  FAMILY HISTORY:  Family History  Problem Relation Age of Onset  . Dementia Father   . Parkinson's disease Father    SOCIAL HISTORY:  reports that she has never smoked. She does not have any smokeless tobacco history on file. She reports that she does not drink alcohol. Her drug history is not on file.  REVIEW OF SYSTEMS:  Per HPI  SUBJECTIVE:   VITAL SIGNS: Temp:  [97.2 F (36.2 C)-97.7 F (36.5 C)] 97.2 F (36.2 C) (02/26 2027) Pulse Rate:  [91-116] 116 (02/26 1533) Resp:  [22-27] 22 (02/26 1533) BP: (112-152)/(67-77) 125/67 mmHg (02/26 1533) SpO2:  [89 %-96 %] 89 % (02/26 1533) Weight:  [145 lb (65.772 kg)] 145 lb (65.772 kg) (02/26 0144) HEMODYNAMICS:   VENTILATOR SETTINGS:   INTAKE / OUTPUT: Intake/Output      02/26 0701 - 02/27 0700   I.V. (mL/kg)    Total Intake(mL/kg)    Net         Urine Occurrence 1 x   Emesis Occurrence 2 x     PHYSICAL EXAMINATION: General:  Woman appearing stated age lying in bed, NAD Neuro:  Awake, alert, moving all extremities. HEENT:  Normal. Neck: Thin neck, distended veins during expiration. No thyromegaly or LAD. Cardiovascular:  Tachycardia, but sounds dual and normal. Lungs:  CTAB Abdomen:  Distended, tender with moderate palpation. Musculoskeletal:  Thin Skin:  No rashes  LABS:  CBC  Recent Labs Lab 11/23/2014 1601 12/02/2014 0555 11/13/2014 2155  WBC 29.7* 31.5* 18.5*  HGB 9.3* 8.3* 8.4*  HCT 27.3* 25.1* 25.0*  PLT 480* 388 278   Coag's  Recent Labs Lab 11/19/2014 0555 11/22/2014 2155  APTT 36 36  INR 1.25 1.34   BMET  Recent Labs Lab 11/27/2014 1601 11/23/2014 0555 11/19/2014 2155  NA 132* 136 133*  K 2.5* 3.3* 3.7  CL 95* 97 104  CO2 24 23 21   BUN 33* 37* 38*  CREATININE 1.53* 1.20* 1.16*  GLUCOSE 134* 87 75   Electrolytes  Recent Labs Lab 11/22/2014 1601 11/15/2014 0555 11/16/2014 2155  CALCIUM 6.8* 6.9* 6.2*  MG  --  1.7  --    Sepsis Markers  Recent Labs Lab  11/17/2014 1615 11/29/2014 2141 11/18/2014 2155  LATICACIDVEN 2.85* 2.3*  --   PROCALCITON  --   --  23.62   ABG No results for input(s): PHART, PCO2ART, PO2ART in the last 168 hours. Liver Enzymes  Recent Labs Lab 11/12/2014 1601 11/26/2014 0555 12/05/2014 2155  AST 21 19 22   ALT 14 12 12   ALKPHOS 87 100 80  BILITOT 1.4* 1.4* 1.3*  ALBUMIN 1.8* 1.5* 1.5*   Cardiac Enzymes  Recent Labs Lab 12/02/2014 0555 12/03/14 2155  TROPONINI <0.03 <0.03   Glucose No results for input(s): GLUCAP in the last 168 hours.  Imaging Ct Abdomen Pelvis Wo Contrast  11/14/2014   CLINICAL DATA:  Edema in the lower extremities beginning about 3 weeks ago and progressively worsening. Weakness beginning this week worsening. Diffuse abdominal pain and nausea for 2 weeks.  EXAM: CT ABDOMEN AND PELVIS WITHOUT CONTRAST  TECHNIQUE: Multidetector CT imaging of the abdomen and pelvis was performed following the standard protocol without IV contrast.  COMPARISON:  None.  FINDINGS: Small right pleural effusion with basilar atelectasis. Small pericardial effusion. Mild cardiac enlargement.  Evaluation of solid organs and vascular structures is limited without IV contrast material. Evaluation of bowel is limited without oral contrast. Loculated appearing fluid collections are demonstrated in the right upper quadrant posterior to the liver and extending inferior to the liver along the right lateral abdominal and pelvic wall. Additional fluid collections are suggested in the mesenteric. There is an air-fluid level in the central mesentery to the left which could represent a focal dilated bowel loop or an abscess. Loculated hyperdense fluid collections versus mass lesions in the pelvis. Nodular lesions in the mesentery. No free air. Small bowel are mildly dilated with diffuse wall thickening. Bladder is decompressed with mild wall thickening. I believe that the changes with most likely represent pelvic masses with diffuse peritoneal  carcinomatosis and associated fluid collections. Abscess or inflammatory process are not excluded. Diffuse enteritis is not excluded. Repeat imaging with oral and IV contrast material would be helpful to better define the abnormalities if the patient can't tolerate contrast.  Tiny subcentimeter lesion in the right lobe of the liver is indeterminate but probably represents a cyst. Gallbladder is mildly contracted. No stones or wall thickening. No bile duct dilatation. Unenhanced appearance of the spleen, pancreas, adrenal glands, kidneys, abdominal aorta, and inferior vena cava is unremarkable. Scattered retroperitoneal and mesenteric lymph nodes are not pathologically enlarged. No free air in the abdomen.  Pelvis: See above. 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 No destructive bone lesions appreciated. Diffuse soft tissue edema.  IMPRESSION: Examination is technically limited without IV contrast material and oral contrast material. 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 Small bowel are mildly dilated with diffusely thickened wall, possibly due to tumor infiltration, reactive inflammation, or enteritis. Central abdominal abscess is not excluded. Multiple loculated fluid  collections. Small right pleural effusion with basilar atelectasis. Diffuse edema.   Electronically Signed   By: Lucienne Capers M.D.   On: 11/27/2014 19:13   Dg Chest 2 View  11/18/2014   CLINICAL DATA:  Shortness of breath, leg swelling  EXAM: CHEST  2 VIEW  COMPARISON:  None.  FINDINGS: Cardiomediastinal silhouette is unremarkable. Mild degenerative changes thoracic spine. There is elevation of the right hemidiaphragm. No acute infiltrate or pulmonary edema. Mild deformity of proximal shaft of the right humerus question prior fracture.  IMPRESSION: No active disease. There is elevation of the right hemidiaphragm. Mild degenerative  changes thoracic spine. Question prior fracture proximal shaft of right humerus.   Electronically Signed   By: Lahoma Crocker M.D.   On: 11/26/2014 16:18   Ct Abdomen Pelvis W Contrast  11/25/2014   CLINICAL DATA:  Abdominal mass. Abdominal distention. 50 lb weight loss.  EXAM: CT ABDOMEN AND PELVIS WITH CONTRAST  TECHNIQUE: Multidetector CT imaging of the abdomen and pelvis was performed using the standard protocol following bolus administration of intravenous contrast.  CONTRAST:  2mL OMNIPAQUE IOHEXOL 300 MG/ML  SOLN  COMPARISON:  Noncontrast CT on 11/17/2014  FINDINGS: Lower Chest: Small right pleural effusion and right lower lobe atelectasis noted.  Hepatobiliary: 2 tiny less than 1 cm low-attenuation lesions are seen in the right and left hepatic lobes which are too small to characterize. No other liver lesions are identified. Gallbladder is unremarkable.  Pancreas: No mass, inflammatory changes, or other significant abnormality identified.  Spleen:  Within normal limits in size and appearance.  Adrenals:  No masses identified.  Kidneys/Urinary Tract:  No evidence of masses or hydronephrosis.  Stomach/Bowel/Peritoneum: Mild ascites and diffuse body wall edema are noted. Mild peritoneal enhancement seen surrounding the liver and in the lower abdomen suspicious for peritoneal carcinomatosis. Mild dilatation of small bowel loops and colon is seen, with transition point near the sigmoid colon.  A large heterogeneously enhancing mass is seen within the right lower quadrant of the abdomen and central pelvis which measures approximately 11.0 x 15.6 cm. This contains some internal gas and appears to be centered on the sigmoid colon. This could also arise from the uterus if patient has not undergone previous hysterectomy, or potentially the ovaries or mesentery.  An extraluminal collection containing both fluid and gas is seen within the small bowel mesentery to the left of midline which measures approximately 8.0 x  8.8 cm on image 55/series 2. This is consistent with abscess secondary to bowel perforation.  Vascular/Lymphatic: Retroperitoneal lymphadenopathy seen in aorto-caval space measuring 2.4 cm on image 48/series 2. No other definite lymphadenopathy identified.  Reproductive: Not well visualized due to large pelvic mass described above.  Other:  None.  Musculoskeletal:  No suspicious bone lesions identified.  IMPRESSION: Large approximately 16 cm mass in the lower abdomen and pelvis causing distal colonic obstruction. This is suspicious for primary colon carcinoma. Differential diagnosis also includes uterine carcinoma, ovarian carcinoma, and GI stromal tumor.  9 cm extraluminal gas and fluid collection in small bowel mesentery, consistent with localized abscess secondary to bowel perforation. No free intraperitoneal air identified.  Moderate ascites and peritoneal enhancement, suspicious for diffuse peritoneal carcinomatosis.  Mild retroperitoneal lymphadenopathy in the aortocaval space.  Two small sub-cm indeterminate liver lesions. Early liver metastases cannot definitely be excluded.  Small right pleural effusion and diffuse body wall edema.  Critical Value/emergent results were called by telephone at the time of interpretation on 11/16/2014 at 2:55 pm to the  patient's floor nurse Silvestre Moment, who verbally acknowledged these results.   Electronically Signed   By: Earle Gell M.D.   On: 11/14/2014 14:48   Dg Chest Port 1 View  11/16/2014   CLINICAL DATA:  Central line placement.  EXAM: PORTABLE CHEST - 1 VIEW  COMPARISON:  11/24/2014  FINDINGS: Left Central line is in place with the tip in the upper right atrium. No pneumothorax. Low lung volumes with bibasilar atelectasis. Heart size is borderline, accentuated by the low volumes.  IMPRESSION: Low lung volumes, bibasilar atelectasis.  Left subclavian central line tip in the upper right atrium. No pneumothorax.   Electronically Signed   By: Rolm Baptise M.D.   On:  11/15/2014 21:47   Dg Chest Port 1 View  11/14/2014   CLINICAL DATA:  Sepsis, tachycardia  EXAM: PORTABLE CHEST - 1 VIEW  COMPARISON:  12/01/2014  FINDINGS: Low lung volumes.  Right basilar opacity, likely atelectasis. No pleural effusion or pneumothorax.  Cardiomegaly.  IMPRESSION: Low lung volumes with right basilar opacity, likely atelectasis.   Electronically Signed   By: Julian Hy M.D.   On: 11/09/2014 19:45   Dg Abd Portable 1v  11/18/2014   CLINICAL DATA:  Tachycardia, sepsis. Diffuse abdominal pain, left lower quadrant pain.  EXAM: PORTABLE ABDOMEN - 1 VIEW  COMPARISON:  CT 11/20/2014  FINDINGS: Mildly dilated small bowel loops are again noted in the abdomen and pelvis. Oral contrast material noted within the stomach can small bowel loops from prior oral contrast for CT. No free air. No organomegaly.  IMPRESSION: No significant change in bowel gas pattern since earlier CT. Dilated small bowel loops again noted. No free air.   Electronically Signed   By: Rolm Baptise M.D.   On: 11/26/2014 19:53    EKG: Sinus Tachycardia, inferior q-waves suggestive of prior infarction. CXR: No infiltrate.  ASSESSMENT / PLAN:  Principal Problem:   Severe sepsis with septic shock Active Problems:   Anasarca   Pelvic mass in female   Hypokalemia   Renal insufficiency   Protein-calorie malnutrition, severe   Bowel perforation   Abscess of gastrointestinal tract   Abdominal mass   Septic shock   PULMONARY A: Hypoxic Respiratory failure P:   Will treat with supplemental oxygen. No indication for mechanical ventilation at this time.  CARDIOVASCULAR A: Shock due to sepsis Abnormal EKG P:   Treat w/ levophed.  Will obtain echo to assess for possible ICM.  RENAL A: AKI, possible CKD Hypokalemia Hypocalcemia P:   Is now fluid replete. Electrolyte replacement protocol.  GASTROINTESTINAL A: Malnutrition Abdominal mass P:   Appreciate input for SGY. Will keep NPO. Possible  radiology intervention tomorrow for drianage.  HEMATOLOGIC A: Possible malignancy Anemia P:   Will need to have continued meetings with patient and family re: how best to work this up, if at all.  INFECTIOUS A: Septic Shock, likely abdominal soruce P:   Treat w/ Zosyn + vanc. No further fluids for now given U/S exam of belly showing lack of IVC variablility / compressibility.  ENDOCRINE A: No active issues P:     NEUROLOGIC A: No active issues:    BEST PRACTICE / DISPOSITION Level of Care:  ICU Primary Service:  PCCM Consultants:  SGY Code Status:  Full Diet:  NPO DVT Px:  heparin GI Px:  Famotidine Skin Integrity:  Intact Social / Family:  Updated on admission.  TODAY'S SUMMARY: 69 y/o woman with abdominal distention and septic shock  I have personally  obtained a history, examined the patient, evaluated laboratory and imaging results, formulated the assessment and plan and placed orders.  CRITICAL CARE: The patient is critically ill with multiple organ systems failure and requires high complexity decision making for assessment and support, frequent evaluation and titration of therapies, application of advanced monitoring technologies and extensive interpretation of multiple databases. Critical Care Time devoted to patient care services described in this note is 75 minutes.   Luz Brazen, MD Pulmonary and [place] Pager: [phone]   11/20/2014, 11:02 PM

## 2014-12-03 NOTE — Progress Notes (Signed)
Patient ID: Nicole Cannon, female   DOB: Feb 22, 1946, 69 y.o.   MRN: [id number]    Subjective: Patient transferred to intensive care unit this evening after deterioration with tachycardia and hypotension. She currently is alert and very aware and conversant and denies abdominal pain. Her son is at the bedside and present for discussions.  Objective: Vital signs in last 24 hours: Temp:  [97.3 F (36.3 C)-97.7 F (36.5 C)] 97.4 F (36.3 C) (02/26 1533) Pulse Rate:  [91-116] 116 (02/26 1533) Resp:  [22-36] 22 (02/26 1533) BP: (112-152)/(56-77) 125/67 mmHg (02/26 1533) SpO2:  [89 %-97 %] 89 % (02/26 1533) Weight:  [145 lb (65.772 kg)] 145 lb (65.772 kg) (02/26 0144) Last BM Date:  (a couple days ago per pt)  Intake/Output from previous day: 02/25 0701 - 02/26 0700 In: 1656.7 [I.V.:1656.7] Out: -  Intake/Output this shift:    General appearance: chronically ill-appearing, very mild increased work of breathing and in no acute distress  Abdomen: Moderately distended. Nontender. Large palpable mass in the right lower quadrant. Extremities: Marked lower extremity edema. Some mild mottling below the knees.  Lab Results:   Recent Labs  11/25/2014 1601 12/01/2014 0555  WBC 29.7* 31.5*  HGB 9.3* 8.3*  HCT 27.3* 25.1*  PLT 480* 388   BMET  Recent Labs  11/18/2014 1601 11/26/2014 0555  NA 132* 136  K 2.5* 3.3*  CL 95* 97  CO2 24 23  GLUCOSE 134* 87  BUN 33* 37*  CREATININE 1.53* 1.20*  CALCIUM 6.8* 6.9*     Studies/Results: Ct Abdomen Pelvis Wo Contrast  11/11/2014   CLINICAL DATA:  Edema in the lower extremities beginning about 3 weeks ago and progressively worsening. Weakness beginning this week worsening. Diffuse abdominal pain and nausea for 2 weeks.  EXAM: CT ABDOMEN AND PELVIS WITHOUT CONTRAST  TECHNIQUE: Multidetector CT imaging of the abdomen and pelvis was performed following the standard protocol without IV contrast.  COMPARISON:  None.  FINDINGS: Small right pleural  effusion with basilar atelectasis. Small pericardial effusion. Mild cardiac enlargement.  Evaluation of solid organs and vascular structures is limited without IV contrast material. Evaluation of bowel is limited without oral contrast. Loculated appearing fluid collections are demonstrated in the right upper quadrant posterior to the liver and extending inferior to the liver along the right lateral abdominal and pelvic wall. Additional fluid collections are suggested in the mesenteric. There is an air-fluid level in the central mesentery to the left which could represent a focal dilated bowel loop or an abscess. Loculated hyperdense fluid collections versus mass lesions in the pelvis. Nodular lesions in the mesentery. No free air. Small bowel are mildly dilated with diffuse wall thickening. Bladder is decompressed with mild wall thickening. I believe that the changes with most likely represent pelvic masses with diffuse peritoneal carcinomatosis and associated fluid collections. Abscess or inflammatory process are not excluded. Diffuse enteritis is not excluded. Repeat imaging with oral and IV contrast material would be helpful to better define the abnormalities if the patient can't tolerate contrast.  Tiny subcentimeter lesion in the right lobe of the liver is indeterminate but probably represents a cyst. Gallbladder is mildly contracted. No stones or wall thickening. No bile duct dilatation. Unenhanced appearance of the spleen, pancreas, adrenal glands, kidneys, abdominal aorta, and inferior vena cava is unremarkable. Scattered retroperitoneal and mesenteric lymph nodes are not pathologically enlarged. No free air in the abdomen.  Pelvis: See above. 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 No destructive bone lesions appreciated. Diffuse  soft tissue edema.  IMPRESSION: Examination is technically limited without IV contrast material and oral contrast material. Constellation of findings as described above I  believe most likely represents large pelvic masses with diffuse peritoneal carcinomatosis. Small bowel are mildly dilated with diffusely thickened wall, possibly due to tumor infiltration, reactive inflammation, or enteritis. Central abdominal abscess is not excluded. Multiple loculated fluid collections. Small right pleural effusion with basilar atelectasis. Diffuse edema.   Electronically Signed   By: Lucienne Capers M.D.   On: 12/05/2014 19:13   Dg Chest 2 View  11/30/2014   CLINICAL DATA:  Shortness of breath, leg swelling  EXAM: CHEST  2 VIEW  COMPARISON:  None.  FINDINGS: Cardiomediastinal silhouette is unremarkable. Mild degenerative changes thoracic spine. There is elevation of the right hemidiaphragm. No acute infiltrate or pulmonary edema. Mild deformity of proximal shaft of the right humerus question prior fracture.  IMPRESSION: No active disease. There is elevation of the right hemidiaphragm. Mild degenerative changes thoracic spine. Question prior fracture proximal shaft of right humerus.   Electronically Signed   By: Lahoma Crocker M.D.   On: 11/29/2014 16:18   Ct Abdomen Pelvis W Contrast  11/14/2014   CLINICAL DATA:  Abdominal mass. Abdominal distention. 50 lb weight loss.  EXAM: CT ABDOMEN AND PELVIS WITH CONTRAST  TECHNIQUE: Multidetector CT imaging of the abdomen and pelvis was performed using the standard protocol following bolus administration of intravenous contrast.  CONTRAST:  25mL OMNIPAQUE IOHEXOL 300 MG/ML  SOLN  COMPARISON:  Noncontrast CT on 11/14/2014  FINDINGS: Lower Chest: Small right pleural effusion and right lower lobe atelectasis noted.  Hepatobiliary: 2 tiny less than 1 cm low-attenuation lesions are seen in the right and left hepatic lobes which are too small to characterize. No other liver lesions are identified. Gallbladder is unremarkable.  Pancreas: No mass, inflammatory changes, or other significant abnormality identified.  Spleen:  Within normal limits in size and  appearance.  Adrenals:  No masses identified.  Kidneys/Urinary Tract:  No evidence of masses or hydronephrosis.  Stomach/Bowel/Peritoneum: Mild ascites and diffuse body wall edema are noted. Mild peritoneal enhancement seen surrounding the liver and in the lower abdomen suspicious for peritoneal carcinomatosis. Mild dilatation of small bowel loops and colon is seen, with transition point near the sigmoid colon.  A large heterogeneously enhancing mass is seen within the right lower quadrant of the abdomen and central pelvis which measures approximately 11.0 x 15.6 cm. This contains some internal gas and appears to be centered on the sigmoid colon. This could also arise from the uterus if patient has not undergone previous hysterectomy, or potentially the ovaries or mesentery.  An extraluminal collection containing both fluid and gas is seen within the small bowel mesentery to the left of midline which measures approximately 8.0 x 8.8 cm on image 55/series 2. This is consistent with abscess secondary to bowel perforation.  Vascular/Lymphatic: Retroperitoneal lymphadenopathy seen in aorto-caval space measuring 2.4 cm on image 48/series 2. No other definite lymphadenopathy identified.  Reproductive: Not well visualized due to large pelvic mass described above.  Other:  None.  Musculoskeletal:  No suspicious bone lesions identified.  IMPRESSION: Large approximately 16 cm mass in the lower abdomen and pelvis causing distal colonic obstruction. This is suspicious for primary colon carcinoma. Differential diagnosis also includes uterine carcinoma, ovarian carcinoma, and GI stromal tumor.  9 cm extraluminal gas and fluid collection in small bowel mesentery, consistent with localized abscess secondary to bowel perforation. No free intraperitoneal air identified.  Moderate ascites and peritoneal enhancement, suspicious for diffuse peritoneal carcinomatosis.  Mild retroperitoneal lymphadenopathy in the aortocaval space.  Two  small sub-cm indeterminate liver lesions. Early liver metastases cannot definitely be excluded.  Small right pleural effusion and diffuse body wall edema.  Critical Value/emergent results were called by telephone at the time of interpretation on 11/17/2014 at 2:55 pm to the patient's floor nurse Gwenn, who verbally acknowledged these results.   Electronically Signed   By: Earle Gell M.D.   On: 11/19/2014 14:48   Dg Chest Port 1 View  11/12/2014   CLINICAL DATA:  Sepsis, tachycardia  EXAM: PORTABLE CHEST - 1 VIEW  COMPARISON:  12/01/2014  FINDINGS: Low lung volumes.  Right basilar opacity, likely atelectasis. No pleural effusion or pneumothorax.  Cardiomegaly.  IMPRESSION: Low lung volumes with right basilar opacity, likely atelectasis.   Electronically Signed   By: Julian Hy M.D.   On: 11/25/2014 19:45   Dg Abd Portable 1v  11/17/2014   CLINICAL DATA:  Tachycardia, sepsis. Diffuse abdominal pain, left lower quadrant pain.  EXAM: PORTABLE ABDOMEN - 1 VIEW  COMPARISON:  CT 11/30/2014  FINDINGS: Mildly dilated small bowel loops are again noted in the abdomen and pelvis. Oral contrast material noted within the stomach can small bowel loops from prior oral contrast for CT. No free air. No organomegaly.  IMPRESSION: No significant change in bowel gas pattern since earlier CT. Dilated small bowel loops again noted. No free air.   Electronically Signed   By: Rolm Baptise M.D.   On: 11/16/2014 19:53    Anti-infectives: Anti-infectives    Start     Dose/Rate Route Frequency Ordered Stop   11/16/2014 1915  piperacillin-tazobactam (ZOSYN) IVPB 3.375 g  Status:  Discontinued     3.375 g 100 mL/hr over 30 Minutes Intravenous  Once 11/17/2014 1913 11/13/2014 1918   11/25/2014 1800  ertapenem (INVANZ) 1 g in sodium chloride 0.9 % 50 mL IVPB     1 g 100 mL/hr over 30 Minutes Intravenous Every 24 hours 12/01/2014 1542     11/29/2014 0400  piperacillin-tazobactam (ZOSYN) IVPB 3.375 g  Status:  Discontinued     3.375  g 12.5 mL/hr over 240 Minutes Intravenous Every 8 hours 11/26/2014 2337 11/15/2014 1542   11/15/2014 2000  piperacillin-tazobactam (ZOSYN) IVPB 3.375 g     3.375 g 12.5 mL/hr over 240 Minutes Intravenous  Once 12/03/2014 1952 12/05/2014 2245      Assessment/Plan: 69 year old female who by CT scan appears to have an 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 There are multiple fluid collections and abdomen that could represent abscesses or loculated noninfected fluid and a large central abscess with air-fluid level. She currently is developing evidence of septic shock. We had a long discussion with the patient and her son at the bedside. Dr. Zella Richer who had evaluated her earlier was present. He and I had reviewed her imaging and physical findings and lab work together to try to arrive at the best decision for her. Unfortunately, her prognosis appears very poor no matter what treatment we recommend. We discussed options including emergency laparotomy this evening. Dr. Zella Richer and I both feel that this would be extremely high risk with a very low chance of recovery and certainly she would be ventilator dependent postoperatively with very poor chance of recovery and wound healing as she is extremely malnourished. We discussed that her tumor could not be removed and the only purpose of  the surgery really would be to try to drain sepsis and possibly divert the bowel. Another option of medical support tonight with percutaneous drainage of her large central abdominal abscess in the morning was discussed. I have reviewed this with radiology this evening that feels there may be a window for percutaneous drainage although that decision would have to be made by the interventional radiologist coming on in the morning. This also was discussed with the patient and her son and they both feel that this is the option they would prefer. They understand the poor  prognosis. I think with his debilitated as she is in the advanced nature of her tumor a less aggressive approach with support and percutaneous drainage at this point is more appropriate than a laparotomy. Critical care medicine to seeing the patient for supportive care and an order has been placed for interventional radiology who will review in the morning. This was also discussed with her medical attending Dr. Grandville Silos at the bedside.    LOS: 1 day    Ayauna Mcnay T 11/19/2014

## 2014-12-03 NOTE — Progress Notes (Signed)
ANTIBIOTIC CONSULT NOTE - INITIAL  Pharmacy Consult for Zosyn Indication: Intra-abdominal infection/abscess?  No Known Allergies  Patient Measurements:   Wt=  Vital Signs: Temp: 97.7 F (36.5 C) (02/25 2325) Temp Source: Oral (02/25 2325) BP: 152/77 mmHg (02/25 2325) Pulse Rate: 101 (02/25 2325) Intake/Output from previous day: 02/25 0701 - 02/26 0700 In: 1100 [I.V.:1100] Out: -  Intake/Output from this shift: Total I/O In: 1100 [I.V.:1100] Out: -   Labs:  Recent Labs  12/05/2014 1601  WBC 29.7*  HGB 9.3*  PLT 480*  CREATININE 1.53*   CrCl cannot be calculated (Unknown ideal weight.). No results for input(s): VANCOTROUGH, VANCOPEAK, VANCORANDOM, GENTTROUGH, GENTPEAK, GENTRANDOM, TOBRATROUGH, TOBRAPEAK, TOBRARND, AMIKACINPEAK, AMIKACINTROU, AMIKACIN in the last 72 hours.   Microbiology: No results found for this or any previous visit (from the past 720 hour(s)).  Medical History: Past Medical History  Diagnosis Date  . Irritable bowel      Assessment: 33 yoF c/o weakness and slight abdominal distention.  Zosyn per Rx for intra-abdominal infection/abcess?  Goal of Therapy:  Treat infection  Plan:   Zosyn 3.375 Gm IV q8h EI  F/u SCr/cultures as needed  Lawana Pai R 11/13/2014,12:46 AM

## 2014-12-03 NOTE — Progress Notes (Signed)
Bilateral lower extremity venous duplex completed:  No evidence of DVT, superficial thrombosis, or Baker's cyst.   

## 2014-12-03 NOTE — Consult Note (Signed)
WOC ostomy consult note Preoperative stoma site selection per [facility] MD's request.  Patient is uncomfortable and is in a state of shock about the problems she is having resulting in surgery.  She is calm and appreciates the assistance of the [place] Nurse. She reports that Will [place], [place] has explained the procedure and why I would be marking her abdomen.  Patient's abdomen assessed in the sitting, standing and lying positions.  She is unable to elevate her head to assist me in the palpation of her abdominal rectus muscle, but her abdomen is flaccid and there are two pronounced creases that would be ideally avoided in stoma creation.  The colostomy site is marked 6cm below the umbilicus and 7.1IW below.  The ileostomy site is marked 6cm to the right of the umbilicus and 5.8KD below.  Higher or lower than these two marks will place the stoma in or near a crease.  The marks are made with a surgical site marking pen and the marks then covered with a thin film transparent dressing.  Patient understands that if an ostomy is created interoperatively, the [place] Nurse would be the person assisting her with the care and management of the stoma. I understand that the patient's son has been with her today, but is not here now. [place] nursing team will follow, and will remain available to this patient, the nursing, surgical and medical teams.  Please reconsult in the event a stoma is created. Thanks, Maudie Flakes, MSN, RN, [place], [place], [place] [phone])

## 2014-12-03 NOTE — Progress Notes (Signed)
CT scan completed and shows a very complicated problem. Large approximately 16 cm mass in the lower abdomen and pelvis causing distal colonic obstruction. This is suspicious for primary colon carcinoma. Differential diagnosis also includes uterine carcinoma, ovarian carcinoma, and GI stromal tumor.  9 cm extraluminal gas and fluid collection in small bowel mesentery, consistent with localized abscess secondary to bowel perforation. No free intraperitoneal air identified.  Moderate ascites and peritoneal enhancement, suspicious for diffuse peritoneal carcinomatosis.  Mild retroperitoneal lymphadenopathy in the aortocaval space.  Two small sub-cm indeterminate liver lesions. Early liver metastases cannot definitely be excluded.  Small right pleural effusion and diffuse body wall edema.  I have talked to the patient and her son.  Dr. Zella Richer is aware.  I have ordered new labs, type and screen.  I think she will need surgery this evening.  We are working towards that now.  I have told her we are not exactly sure if this is a tumor that has eroded into the sigmoid colon, or if it is a perforation with a large reactive response. I have ask the Ostomy care Nursing staff to try and mark her.  I am also asking for a PICC line. She has had several sticks without success.  I have explained we are not exactly sure what has occurred but that we need to explore her and try to fix whatever is leaking.  This will most likely involve a colostomy.  I also told them to expect her to be in the ICU after surgery.    Currently she is still distended and sore, no real peritoneal signs right now.  Dr. Zella Richer will see her and discuss thing with her after surgery and he has an opportunity to review the CT scan.

## 2014-12-03 NOTE — Progress Notes (Signed)
UR complete 

## 2014-12-03 NOTE — Procedures (Signed)
Central Venous Catheter Insertion Procedure Note Kassi Esteve [id number] [birth year]  Procedure: Insertion of Central Venous Catheter Indications: Assessment of intravascular volume, Drug and/or fluid administration and Frequent blood sampling  Procedure Details Consent: Risks of procedure as well as the alternatives and risks of each were explained to the (patient/caregiver).  Consent for procedure obtained. Time Out: Verified patient identification, verified procedure, site/side was marked, verified correct patient position, special equipment/implants available, medications/allergies/relevent history reviewed, required imaging and test results available.  Performed  Maximum sterile technique was used including antiseptics, cap, gloves, gown, hand hygiene, mask and sheet. Skin prep: Chlorhexidine; local anesthetic administered A antimicrobial bonded/coated triple lumen catheter was placed in the left subclavian vein using the Seldinger technique.  Evaluation Blood flow good Complications: No apparent complications Patient did tolerate procedure well. Chest X-ray ordered to verify placement.  CXR: normal.  Olga Bourbeau 11/23/2014, 11:00 PM

## 2014-12-03 NOTE — Progress Notes (Signed)
Writer notified of critical  CT results by Dr. Horace Porteous in radiology. Attending MD notified of results, MD returned call. Surgery notified via STAT page through office paging system. Awaiting return call from surgery.

## 2014-12-03 NOTE — Progress Notes (Signed)
Progress Note   Derotha Fishbaugh DQQ:[id number] DOB: 1946-04-24 DOA: 12/01/2014 PCP: Abigail Miyamoto, MD   Brief Narrative:   Nicole Cannon is an 69 y.o. female with a PMH of irritable bowel syndrome who was admitted 11/19/2014 with a chief complaint of generalized weakness, a 2 week history of swelling of the lower extremities, progressive abdominal distention, and significant 50 pound weight loss. Upon initial evaluation in the ED, she was found to be hypokalemic and mildly renally insufficient. A noncontrasted CT of her abdomen and pelvis showed large pelvic masses with diffuse peritoneal carcinomatosis.  Assessment/Plan:   Principal Problem:   Pelvic mass/ovarian mass/anasarca  Repeat CT scan with contrast. Seen by surgery.  IR consultation for biopsy.  Anasarca likely from malignancy, malnutrition and possible lymphatic obstruction.  Follow-up tumor markers including CEA and CA 125.  Follow-up lower extremity Dopplers.  Active Problems:   Intra-abdominal abscess  On empiric antibiotics with Zosyn.  Surgery following.  Repeat CT scan to better define tissue abnormality/abscess.  Percutaneous drain possible.   Severe protein calorie malnutrition  50 pound weight loss noted. Albumin 1.9.   Dietitian consultation requested.    Hypokalemia  Repleting. Magnesium WNL.    Renal insufficiency  Unclear if this is acute or chronic, but creatinine has improved with hydration.    Normocytic anemia  Anemia panel pending.     Elevated d-dimer   With probable malignancy, need to rule out PE.    DVT Prophylaxis  SCDs ordered.  Code Status: Full. Family Communication: No family at the bedside. Disposition Plan: Home when stable.  Lives alone.     IV Access:    Peripheral IV   Procedures and diagnostic studies:   Ct Abdomen Pelvis Wo Contrast 11/19/2014: Examination is technically limited without IV contrast material and oral contrast material. 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 Small bowel are mildly dilated with diffusely thickened wall, possibly due to tumor infiltration, reactive inflammation, or enteritis. Central abdominal abscess is not excluded. Multiple loculated fluid collections. Small right pleural effusion with basilar atelectasis. Diffuse edema.     Dg Chest 2 View 11/19/2014: No active disease. There is elevation of the right hemidiaphragm. Mild degenerative changes thoracic spine. Question prior fracture proximal shaft of right humerus.     Medical Consultants:    Dr. Alphonsa Overall, Surgery  Anti-Infectives:    Zosyn 11/20/2014--->  Subjective:   Loel Dubonnet reports that her abdominal pain is currently controlled.  No nausea/vomiting.  Has had some diarrhea.  No dyspnea/cough.  Reports a 50 lb weight loss since June.  Objective:    Filed Vitals:   12/01/2014 2211 11/18/2014 2325 11/13/2014 0144 11/14/2014 0519  BP: 122/56 152/77  112/70  Pulse: 99 101  91  Temp:  97.7 F (36.5 C)  97.3 F (36.3 C)  TempSrc:  Oral  Oral  Resp: 36 22  27  Height:   5\' 2"  (1.575 m)   Weight:   65.772 kg (145 lb)   SpO2: 94% 96%  95%    Intake/Output Summary (Last 24 hours) at 11/17/2014 0757 Last data filed at 11/12/2014 0700  Gross per 24 hour  Intake 1656.67 ml  Output      0 ml  Net 1656.67 ml    Exam: Gen:  NAD Cardiovascular:  RRR, No M/R/G Respiratory:  Lungs CTAB Gastrointestinal:  Abdomen distended, firm, +BS Extremities:  2+ edema, SCDs on  Data Reviewed:    Labs: Basic Metabolic Panel:  Recent Labs Lab 11/20/2014 1601 11/25/2014 0555  NA 132* 136  K 2.5* 3.3*  CL 95* 97  CO2 24 23  GLUCOSE 134* 87  BUN 33* 37*  CREATININE 1.53* 1.20*  CALCIUM 6.8* 6.9*  MG  --  1.7   GFR Estimated Creatinine Clearance: 38.8 mL/min (by C-G formula based on Cr of 1.2). Liver Function Tests:  Recent Labs Lab 12/05/2014 1601  11/16/2014 0555  AST 21 19  ALT 14 12  ALKPHOS 87 100  BILITOT 1.4* 1.4*  PROT 5.7* 5.2*  ALBUMIN 1.8* 1.5*    Recent Labs Lab 11/30/2014 1601  LIPASE 10*   Coagulation profile  Recent Labs Lab 11/18/2014 0555  INR 1.25    CBC:  Recent Labs Lab 11/28/2014 1601 11/30/2014 0555  WBC 29.7* 31.5*  NEUTROABS 28.2* PENDING  HGB 9.3* 8.3*  HCT 27.3* 25.1*  MCV 78.0 79.2  PLT 480* 388   Cardiac Enzymes:  Recent Labs Lab 11/09/2014 0555  TROPONINI <0.03   D-Dimer:  Recent Labs  11/26/2014 0555  DDIMER 13.56*   Sepsis Labs:  Recent Labs Lab 11/11/2014 1601 11/08/2014 1615 11/25/2014 0555  WBC 29.7*  --  31.5*  LATICACIDVEN  --  2.85*  --    Microbiology No results found for this or any previous visit (from the past 240 hour(s)).   Medications:   . iohexol  25 mL Oral Q1 Hr x 2  . piperacillin-tazobactam (ZOSYN)  IV  3.375 g Intravenous Q8H  . sodium chloride  3 mL Intravenous Q12H   Continuous Infusions: . 0.9 % NaCl with KCl 40 mEq / L 100 mL/hr (11/11/2014 0126)    Time spent: 35 minutes with > 50% of time discussing current diagnostic test results, clinical impression and plan of care.    LOS: 1 day   RAMA,CHRISTINA  [facility] Pager [phone]. If unable to reach me by pager, please call my cell phone at [phone].  *Please refer to [email or web address], password [email or web address] to get updated schedule on who will round on this patient, as hospitalists switch teams weekly. If 7PM-7AM, please contact night-coverage at [email or web address], password [email or web address] for any overnight needs.  11/18/2014, 7:57 AM

## 2014-12-04 ENCOUNTER — Encounter (HOSPITAL_COMMUNITY): Payer: Self-pay | Admitting: Radiology

## 2014-12-04 ENCOUNTER — Inpatient Hospital Stay (HOSPITAL_COMMUNITY): Payer: Commercial Managed Care - HMO

## 2014-12-04 DIAGNOSIS — J9601 Acute respiratory failure with hypoxia: Secondary | ICD-10-CM

## 2014-12-04 DIAGNOSIS — I319 Disease of pericardium, unspecified: Secondary | ICD-10-CM

## 2014-12-04 DIAGNOSIS — K630 Abscess of intestine: Secondary | ICD-10-CM

## 2014-12-04 DIAGNOSIS — A419 Sepsis, unspecified organism: Principal | ICD-10-CM

## 2014-12-04 DIAGNOSIS — R6521 Severe sepsis with septic shock: Secondary | ICD-10-CM

## 2014-12-04 LAB — BLOOD GAS, ARTERIAL
Acid-base deficit: 5.4 mmol/L — ABNORMAL HIGH (ref 0.0–2.0)
BICARBONATE: 19.3 meq/L — AB (ref 20.0–24.0)
FIO2: 1 %
LHR: 14 {breaths}/min
MECHVT: 400 mL
O2 SAT: 99 %
PEEP: 5 cmH2O
Patient temperature: 98.6
TCO2: 18.6 mmol/L (ref 0–100)
pCO2 arterial: 36.6 mmHg (ref 35.0–45.0)
pH, Arterial: 7.341 — ABNORMAL LOW (ref 7.350–7.450)
pO2, Arterial: 299 mmHg — ABNORMAL HIGH (ref 80.0–100.0)

## 2014-12-04 LAB — CORTISOL: Cortisol, Plasma: >150 ug/dL

## 2014-12-04 LAB — CEA: CEA: 0.8 ng/mL (ref 0.0–4.7)

## 2014-12-04 LAB — LACTIC ACID, PLASMA: Lactic Acid, Venous: 2.1 mmol/L (ref 0.5–2.0)

## 2014-12-04 LAB — ABO/RH: ABO/RH(D): O POS

## 2014-12-04 LAB — TRIGLYCERIDES: TRIGLYCERIDES: 154 mg/dL — AB (ref <150)

## 2014-12-04 LAB — CA 125: CA 125: 403.2 U/mL — ABNORMAL HIGH (ref 0.0–34.0)

## 2014-12-04 MED ORDER — CETYLPYRIDINIUM CHLORIDE 0.05 % MT LIQD
7.0000 mL | Freq: Four times a day (QID) | OROMUCOSAL | Status: DC
  Administered 2014-12-05 – 2014-12-11 (×28): 7 mL via OROMUCOSAL

## 2014-12-04 MED ORDER — CHLORHEXIDINE GLUCONATE 0.12 % MT SOLN
15.0000 mL | Freq: Two times a day (BID) | OROMUCOSAL | Status: DC
Start: 2014-12-04 — End: 2014-12-12
  Administered 2014-12-04 – 2014-12-11 (×14): 15 mL via OROMUCOSAL
  Filled 2014-12-04 (×12): qty 15

## 2014-12-04 MED ORDER — CHLORHEXIDINE GLUCONATE 0.12 % MT SOLN: 15.0000 mL | Freq: Two times a day (BID) | OROMUCOSAL | Status: DC

## 2014-12-04 MED ORDER — FENTANYL CITRATE 0.05 MG/ML IJ SOLN
INTRAMUSCULAR | Status: AC
  Administered 2014-12-04: 100 ug
  Filled 2014-12-04: qty 4

## 2014-12-04 MED ORDER — LACTATED RINGERS IV BOLUS (SEPSIS)
1000.0000 mL | Freq: Once | INTRAVENOUS | Status: AC
  Administered 2014-12-04: 1000 mL via INTRAVENOUS

## 2014-12-04 MED ORDER — FLUMAZENIL 0.5 MG/5ML IV SOLN
0.1000 mg | Freq: Once | INTRAVENOUS | Status: AC
  Administered 2014-12-04: 0.1 mg via INTRAVENOUS

## 2014-12-04 MED ORDER — NALOXONE HCL 0.4 MG/ML IJ SOLN
0.4000 mg | INTRAMUSCULAR | Status: DC | PRN
  Administered 2014-12-04: 0.4 mg via INTRAVENOUS

## 2014-12-04 MED ORDER — BISACODYL 10 MG RE SUPP: 10.0000 mg | Freq: Two times a day (BID) | RECTAL | Status: DC | PRN

## 2014-12-04 MED ORDER — FENTANYL CITRATE 0.05 MG/ML IJ SOLN
INTRAMUSCULAR | Status: AC | PRN
  Administered 2014-12-04: 50 ug via INTRAVENOUS

## 2014-12-04 MED ORDER — FENTANYL CITRATE 0.05 MG/ML IJ SOLN
25.0000 ug | INTRAMUSCULAR | Status: DC | PRN
  Administered 2014-12-04: 25 ug via INTRAVENOUS
  Filled 2014-12-04: qty 2

## 2014-12-04 MED ORDER — SODIUM CHLORIDE 0.9 % IV SOLN: 1.0000 g | Freq: Once | INTRAVENOUS | Status: DC

## 2014-12-04 MED ORDER — SODIUM CHLORIDE 0.9 % IV SOLN
INTRAVENOUS | Status: DC
  Administered 2014-12-06 – 2014-12-10 (×4): via INTRAVENOUS

## 2014-12-04 MED ORDER — PROPOFOL 10 MG/ML IV EMUL
0.0000 ug/kg/min | INTRAVENOUS | Status: DC
  Administered 2014-12-04 – 2014-12-05 (×3): 30 ug/kg/min via INTRAVENOUS
  Filled 2014-12-04 (×3): qty 100

## 2014-12-04 MED ORDER — ROCURONIUM BROMIDE 50 MG/5ML IV SOLN
INTRAVENOUS | Status: AC
Start: 2014-12-04 — End: 2014-12-05
  Filled 2014-12-04: qty 2

## 2014-12-04 MED ORDER — ETOMIDATE 2 MG/ML IV SOLN
INTRAVENOUS | Status: AC
  Administered 2014-12-04: 10 mg
  Filled 2014-12-04: qty 20

## 2014-12-04 MED ORDER — SUCCINYLCHOLINE CHLORIDE 20 MG/ML IJ SOLN
INTRAMUSCULAR | Status: AC
  Filled 2014-12-04: qty 1

## 2014-12-04 MED ORDER — LIDOCAINE HCL (CARDIAC) 20 MG/ML IV SOLN
INTRAVENOUS | Status: AC
  Filled 2014-12-04: qty 5

## 2014-12-04 MED ORDER — MIDAZOLAM HCL 2 MG/2ML IJ SOLN
INTRAMUSCULAR | Status: AC
  Administered 2014-12-04: 2 mg
  Filled 2014-12-04: qty 6

## 2014-12-04 MED ORDER — CETYLPYRIDINIUM CHLORIDE 0.05 % MT LIQD: 7.0000 mL | Freq: Two times a day (BID) | OROMUCOSAL | Status: DC

## 2014-12-04 MED ORDER — FENTANYL CITRATE 0.05 MG/ML IJ SOLN
50.0000 ug | INTRAMUSCULAR | Status: DC | PRN
  Administered 2014-12-04: 100 ug via INTRAVENOUS
  Administered 2014-12-06 – 2014-12-07 (×4): 50 ug via INTRAVENOUS
  Administered 2014-12-08: 25 ug via INTRAVENOUS
  Administered 2014-12-08: 50 ug via INTRAVENOUS
  Administered 2014-12-08: 25 ug via INTRAVENOUS
  Administered 2014-12-08 – 2014-12-09 (×3): 50 ug via INTRAVENOUS
  Filled 2014-12-04 (×2): qty 2

## 2014-12-04 MED ORDER — MIDAZOLAM HCL 2 MG/2ML IJ SOLN
INTRAMUSCULAR | Status: AC | PRN
  Administered 2014-12-04: 1 mg via INTRAVENOUS

## 2014-12-04 MED ORDER — MIDAZOLAM HCL 2 MG/2ML IJ SOLN
1.0000 mg | INTRAMUSCULAR | Status: DC | PRN
  Administered 2014-12-04 – 2014-12-06 (×5): 1 mg via INTRAVENOUS
  Filled 2014-12-04 (×6): qty 2

## 2014-12-04 NOTE — Progress Notes (Addendum)
Chaplain paged at [facility] at Ms Breithaupt's request. Chaplain arrived in unit at 75.   Ms Holten has been given frightening medical news concerning the masses in developing in her abdomen. The news has kept her awake and worrying about what will happen to her. She has been praying for right outcomes. The Chaplain assisting in forming her prayers and easing her spiritual pain. Through active listening and calming care Ms Margretta Sidle became less anxious and less fearful of what my happen later in the day.  Prayers were asked for and given/  Present in the room is Ms Graig's son who is providing excellent and compassionate support and care.   Ms Lekas has high praise for her nursing care and for the nurse's quick response to her physical, mental and spiritual needs.  Page chaplain immediately if Ms Margretta Sidle needs or requests further spiritual care.   Sallee Lange. Nialah Saravia, DMin, MDiv, MA Chaplain

## 2014-12-04 NOTE — Progress Notes (Signed)
PULMONARY / CRITICAL CARE MEDICINE HISTORY AND PHYSICAL EXAMINATION   Name: Nicole Cannon MRN: [id number] DOB: [birth year]    ADMISSION DATE:  11/17/2014  PRIMARY SERVICE: PCCM  CHIEF COMPLAINT:  Weakness, abdominal distension  BRIEF PATIENT DESCRIPTION: 69 year old woman with an abdominal mass, malnutrition, and septic shock.  SIGNIFICANT EVENTS / STUDIES:  2/27> Large 16cm mass in lower abdomen and pelvis causing distal obstruction, suspicious for colon carcinoma, consider uterin or ovarian carcinoma, 9cm extraluminal gass in small bowel mesentery, consistent with localise abscess, no peritoneal free air, small liver lesions.  Mild retroperitoneal lymphadenopathy, small right pleural effusion      SUBJECTIVE:  Has ongoing abdominal pain, wants to eat  VITAL SIGNS: Temp:  [97.2 F (36.2 C)-97.6 F (36.4 C)] 97.6 F (36.4 C) (02/27 0745) Pulse Rate:  [106-117] 111 (02/27 0600) Resp:  [22-43] 35 (02/27 0600) BP: (75-135)/(23-89) 112/47 mmHg (02/27 0630) SpO2:  [89 %-100 %] 97 % (02/27 0600) HEMODYNAMICS: CVP:  [7 mmHg] 7 mmHg VENTILATOR SETTINGS:   INTAKE / OUTPUT: Intake/Output      02/26 0701 - 02/27 0700 02/27 0701 - 02/28 0700   I.V. (mL/kg) 2906.5 (44.2)    IV Piggyback 350    Total Intake(mL/kg) 3256.5 (49.5)    Urine (mL/kg/hr) 130 (0.1)    Stool 0 (0)    Total Output 130     Net +3126.5          Urine Occurrence 1 x    Stool Occurrence 1 x    Emesis Occurrence 2 x      PHYSICAL EXAMINATION: General:  No acute distress HEENT: NCAT, EOMi PULM: tachypnea, but speaking in full sentences, normal respiratory effort, lungs clear CV: RRR, no mgr Ab: mildly distended, tender to touch Ext: warm, massive edema throughout Neuro: A&Ox4, maew  LABS:  CBC  Recent Labs Lab 12/03/2014 1601 11/22/2014 0555 11/11/2014 2155  WBC 29.7* 31.5* 18.5*  HGB 9.3* 8.3* 8.4*  HCT 27.3* 25.1* 25.0*  PLT 480* 388 278   Coag's  Recent Labs Lab 11/09/2014 0555 11/20/2014 2155   APTT 36 36  INR 1.25 1.34   BMET  Recent Labs Lab 11/12/2014 1601 11/11/2014 0555 11/24/2014 2155  NA 132* 136 133*  K 2.5* 3.3* 3.7  CL 95* 97 104  CO2 24 23 21   BUN 33* 37* 38*  CREATININE 1.53* 1.20* 1.16*  GLUCOSE 134* 87 75   Electrolytes  Recent Labs Lab 11/20/2014 1601 11/11/2014 0555 11/08/2014 2155  CALCIUM 6.8* 6.9* 6.2*  MG  --  1.7  --    Sepsis Markers  Recent Labs Lab 11/18/2014 1615 11/24/2014 2141 11/26/2014 2155  LATICACIDVEN 2.85* 2.3*  --   PROCALCITON  --   --  23.62   ABG No results for input(s): PHART, PCO2ART, PO2ART in the last 168 hours. Liver Enzymes  Recent Labs Lab 11/27/2014 1601 12/01/2014 0555 11/26/2014 2155  AST 21 19 22   ALT 14 12 12   ALKPHOS 87 100 80  BILITOT 1.4* 1.4* 1.3*  ALBUMIN 1.8* 1.5* 1.5*   Cardiac Enzymes  Recent Labs Lab 11/20/2014 0555 12/01/2014 2155  TROPONINI <0.03 <0.03   Glucose No results for input(s): GLUCAP in the last 168 hours.  Imaging Ct Abdomen Pelvis Wo Contrast  12/02/2014   CLINICAL DATA:  Edema in the lower extremities beginning about 3 weeks ago and progressively worsening. Weakness beginning this week worsening. Diffuse abdominal pain and nausea for 2 weeks.  EXAM: CT ABDOMEN AND PELVIS WITHOUT CONTRAST  TECHNIQUE: Multidetector CT imaging of the abdomen and pelvis was performed following the standard protocol without IV contrast.  COMPARISON:  None.  FINDINGS: Small right pleural effusion with basilar atelectasis. Small pericardial effusion. Mild cardiac enlargement.  Evaluation of solid organs and vascular structures is limited without IV contrast material. Evaluation of bowel is limited without oral contrast. Loculated appearing fluid collections are demonstrated in the right upper quadrant posterior to the liver and extending inferior to the liver along the right lateral abdominal and pelvic wall. Additional fluid collections are suggested in the mesenteric. There is an air-fluid level in the central  mesentery to the left which could represent a focal dilated bowel loop or an abscess. Loculated hyperdense fluid collections versus mass lesions in the pelvis. Nodular lesions in the mesentery. No free air. Small bowel are mildly dilated with diffuse wall thickening. Bladder is decompressed with mild wall thickening. I believe that the changes with most likely represent pelvic masses with diffuse peritoneal carcinomatosis and associated fluid collections. Abscess or inflammatory process are not excluded. Diffuse enteritis is not excluded. Repeat imaging with oral and IV contrast material would be helpful to better define the abnormalities if the patient can't tolerate contrast.  Tiny subcentimeter lesion in the right lobe of the liver is indeterminate but probably represents a cyst. Gallbladder is mildly contracted. No stones or wall thickening. No bile duct dilatation. Unenhanced appearance of the spleen, pancreas, adrenal glands, kidneys, abdominal aorta, and inferior vena cava is unremarkable. Scattered retroperitoneal and mesenteric lymph nodes are not pathologically enlarged. No free air in the abdomen.  Pelvis: See above. 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 No destructive bone lesions appreciated. Diffuse soft tissue edema.  IMPRESSION: Examination is technically limited without IV contrast material and oral contrast material. 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 Small bowel are mildly dilated with diffusely thickened wall, possibly due to tumor infiltration, reactive inflammation, or enteritis. Central abdominal abscess is not excluded. Multiple loculated fluid collections. Small right pleural effusion with basilar atelectasis. Diffuse edema.   Electronically Signed   By: Lucienne Capers M.D.   On: 12/04/2014 19:13   Dg Chest 2 View  11/25/2014   CLINICAL DATA:  Shortness of breath, leg  swelling  EXAM: CHEST  2 VIEW  COMPARISON:  None.  FINDINGS: Cardiomediastinal silhouette is unremarkable. Mild degenerative changes thoracic spine. There is elevation of the right hemidiaphragm. No acute infiltrate or pulmonary edema. Mild deformity of proximal shaft of the right humerus question prior fracture.  IMPRESSION: No active disease. There is elevation of the right hemidiaphragm. Mild degenerative changes thoracic spine. Question prior fracture proximal shaft of right humerus.   Electronically Signed   By: Lahoma Crocker M.D.   On: 11/16/2014 16:18   Ct Abdomen Pelvis W Contrast  11/12/2014   CLINICAL DATA:  Abdominal mass. Abdominal distention. 50 lb weight loss.  EXAM: CT ABDOMEN AND PELVIS WITH CONTRAST  TECHNIQUE: Multidetector CT imaging of the abdomen and pelvis was performed using the standard protocol following bolus administration of intravenous contrast.  CONTRAST:  10mL OMNIPAQUE IOHEXOL 300 MG/ML  SOLN  COMPARISON:  Noncontrast CT on 11/10/2014  FINDINGS: Lower Chest: Small right pleural effusion and right lower lobe atelectasis noted.  Hepatobiliary: 2 tiny less than 1 cm low-attenuation lesions are seen in the right and left hepatic lobes which are too small to characterize. No other liver lesions are identified. Gallbladder is unremarkable.  Pancreas:  No mass, inflammatory changes, or other significant abnormality identified.  Spleen:  Within normal limits in size and appearance.  Adrenals:  No masses identified.  Kidneys/Urinary Tract:  No evidence of masses or hydronephrosis.  Stomach/Bowel/Peritoneum: Mild ascites and diffuse body wall edema are noted. Mild peritoneal enhancement seen surrounding the liver and in the lower abdomen suspicious for peritoneal carcinomatosis. Mild dilatation of small bowel loops and colon is seen, with transition point near the sigmoid colon.  A large heterogeneously enhancing mass is seen within the right lower quadrant of the abdomen and central pelvis  which measures approximately 11.0 x 15.6 cm. This contains some internal gas and appears to be centered on the sigmoid colon. This could also arise from the uterus if patient has not undergone previous hysterectomy, or potentially the ovaries or mesentery.  An extraluminal collection containing both fluid and gas is seen within the small bowel mesentery to the left of midline which measures approximately 8.0 x 8.8 cm on image 55/series 2. This is consistent with abscess secondary to bowel perforation.  Vascular/Lymphatic: Retroperitoneal lymphadenopathy seen in aorto-caval space measuring 2.4 cm on image 48/series 2. No other definite lymphadenopathy identified.  Reproductive: Not well visualized due to large pelvic mass described above.  Other:  None.  Musculoskeletal:  No suspicious bone lesions identified.  IMPRESSION: Large approximately 16 cm mass in the lower abdomen and pelvis causing distal colonic obstruction. This is suspicious for primary colon carcinoma. Differential diagnosis also includes uterine carcinoma, ovarian carcinoma, and GI stromal tumor.  9 cm extraluminal gas and fluid collection in small bowel mesentery, consistent with localized abscess secondary to bowel perforation. No free intraperitoneal air identified.  Moderate ascites and peritoneal enhancement, suspicious for diffuse peritoneal carcinomatosis.  Mild retroperitoneal lymphadenopathy in the aortocaval space.  Two small sub-cm indeterminate liver lesions. Early liver metastases cannot definitely be excluded.  Small right pleural effusion and diffuse body wall edema.  Critical Value/emergent results were called by telephone at the time of interpretation on 11/23/2014 at 2:55 pm to the patient's floor nurse Gwenn, who verbally acknowledged these results.   Electronically Signed   By: Earle Gell M.D.   On: 12/04/2014 14:48   Dg Chest Port 1 View  11/23/2014   CLINICAL DATA:  Central line placement.  EXAM: PORTABLE CHEST - 1 VIEW   COMPARISON:  11/16/2014  FINDINGS: Left Central line is in place with the tip in the upper right atrium. No pneumothorax. Low lung volumes with bibasilar atelectasis. Heart size is borderline, accentuated by the low volumes.  IMPRESSION: Low lung volumes, bibasilar atelectasis.  Left subclavian central line tip in the upper right atrium. No pneumothorax.   Electronically Signed   By: Rolm Baptise M.D.   On: 11/11/2014 21:47   Dg Chest Port 1 View  11/13/2014   CLINICAL DATA:  Sepsis, tachycardia  EXAM: PORTABLE CHEST - 1 VIEW  COMPARISON:  11/10/2014  FINDINGS: Low lung volumes.  Right basilar opacity, likely atelectasis. No pleural effusion or pneumothorax.  Cardiomegaly.  IMPRESSION: Low lung volumes with right basilar opacity, likely atelectasis.   Electronically Signed   By: Julian Hy M.D.   On: 11/28/2014 19:45   Dg Abd Portable 1v  11/20/2014   CLINICAL DATA:  Tachycardia, sepsis. Diffuse abdominal pain, left lower quadrant pain.  EXAM: PORTABLE ABDOMEN - 1 VIEW  COMPARISON:  CT 11/27/2014  FINDINGS: Mildly dilated small bowel loops are again noted in the abdomen and pelvis. Oral contrast material noted within the stomach  can small bowel loops from prior oral contrast for CT. No free air. No organomegaly.  IMPRESSION: No significant change in bowel gas pattern since earlier CT. Dilated small bowel loops again noted. No free air.   Electronically Signed   By: Rolm Baptise M.D.   On: 12/05/2014 19:53    EKG: Sinus Tachycardia, inferior q-waves suggestive of prior infarction. CXR: No infiltrate.  ASSESSMENT / PLAN:  Principal Problem:   Severe sepsis with septic shock Active Problems:   Anasarca   Pelvic mass in female   Hypokalemia   Renal insufficiency   Protein-calorie malnutrition, severe   Bowel perforation   Abscess of gastrointestinal tract   Abdominal mass   Septic shock   PULMONARY A: Acute hypoxemic Respiratory failure> presumably due to atelectasis, no clear  pulmonary infiltrate P:   O2 as needed for O2 saturation> 92% Check CXR in AM  CARDIOVASCULAR A: Shock due to sepsis, WBC and Renal function improving but levophed requirement going up Abnormal EKG LINES / TUBES: Left [place] CVC - 11/28/2014 >  P:   Check CVP, goal 8-12, may need more saline resuscitation Repeat Lactic acid Levophed for MAP > 65 F/U echocardiogram   RENAL A: AKI, possible CKD Hypokalemia Hypocalcemia P:   Start continuous IV fluids until able to take PO Monitor BMET and UOP Replace electrolytes as needed   GASTROINTESTINAL A: Severe protein calorie malnutrition Abdominal mass Abdominal abscess P:   IR guided drainage of abdominal abscess Clears after drainage if OK by surgery  HEMATOLOGIC A: Possible colon or GYN malignancy Anemia P:   Will  need colonoscopy after intra-abdominal infections dealt with  INFECTIOUS A: Septic Shock, due to intra-abdominal abscess  CULTURES: Blood - 2/26 - pending  P:   ANTIBIOTICS: Pip-tazo: 2/25- Vanc: 2/26-  IR Guided drainage today  ENDOCRINE A: No active issues P:     NEUROLOGIC A: No active issues:    BEST PRACTICE / DISPOSITION Level of Care:  ICU Primary Service:  PCCM Consultants:  SGY Code Status:  Full Diet:  NPO > clears to day if able DVT Px:  heparin GI Px:  Famotidine Skin Integrity:  Intact Social / Family:  Updated on admission.  TODAY'S SUMMARY: 69 y/o woman with abdominal distention and septic shock due to abdominal abscesses, plan IR guided drainage today.  My cc time 45 minutes   Roselie Awkward, MD [place] [facility] Pager: [phone] Cell: [phone] If no response, call [phone]    12/04/2014, 10:17 AM

## 2014-12-04 NOTE — Progress Notes (Signed)
Paged Dr. Lake Bells, CCM MD, informed of episode of apnea while patient was prone in attempt to place CT guided drain. For patient safety, when procedure was aborted, patient was transported back to [facility] ICU/SD for monitoring.  Informed Dr. Lake Bells of patient's current status, CVP of 7. Dr. Lake Bells to consult with Dr. Johney Maine to determine plan of care moving forward. Per Dr. Lake Bells, give 500 ml NS bolus for CVP of 7. RN to continue to monitor.   Evorn Gong, RN/BSN

## 2014-12-04 NOTE — Progress Notes (Signed)
Nicole Cannon  May 17, 1946 [id number]  Patient Care Team: Abigail Miyamoto, MD as PCP - General (Family Medicine) Abigail Miyamoto, MD as Consulting Physician (Family Medicine)  This patient is a 69 y.o.female   Patient had episode of apnea while supine during attempted drain placement.  Drainage procedure aborted.  Discussed with Dr. Barbie Banner with interventional radiology.  Discussed with Dr. Halford Chessman with critical care medicine.  Discussed with CRNA Danley Danker with anesthesia.  She will discuss with her anesthesiolist, Dr. Delma Post.     Retry drainage procedure after airway better secured.  General anesthesia with endotracheal tube to allow supine positioning to allow percutaneous drainage of contained abscess in the hopes of avoiding emergent laparotomy.  She hopefully will need it only during the time of the procedure and that can be extubated post procedure.  Patient Active Problem List   Diagnosis Date Noted  . Protein-calorie malnutrition, severe 11/27/2014  . Severe sepsis with septic shock 12/05/2014  . Bowel perforation 12/02/2014  . Septic shock 11/28/2014  . Abscess of gastrointestinal tract   . Abdominal mass   . Anasarca 11/18/2014  . Pelvic mass in female 11/16/2014  . Hypokalemia 11/18/2014  . Renal insufficiency 12/01/2014    Past Medical History  Diagnosis Date  . Irritable bowel     Past Surgical History  Procedure Laterality Date  . Tonsillectomy      History   Social History  . Marital Status: Divorced    Spouse Name: N/A  . Number of Children: N/A  . Years of Education: N/A   Occupational History  . Not on file.   Social History Main Topics  . Smoking status: Never Smoker   . Smokeless tobacco: Not on file  . Alcohol Use: No  . Drug Use: Not on file  . Sexual Activity: Not on file   Other Topics Concern  . Not on file   Social History Narrative   Divorced,  Has a son [name]    Family History  Problem Relation Age of Onset  . Dementia Father    . Parkinson's disease Father     Current Facility-Administered Medications  Medication Dose Route Frequency Provider Last Rate Last Dose  . 0.9 %  sodium chloride infusion  250 mL Intravenous PRN Luz Brazen, MD      . 0.9 %  sodium chloride infusion   Intravenous Continuous Juanito Doom, MD      . acetaminophen (TYLENOL) tablet 650 mg  650 mg Oral Q6H PRN Jani Gravel, MD       Or  . acetaminophen (TYLENOL) suppository 650 mg  650 mg Rectal Q6H PRN Jani Gravel, MD      . antiseptic oral rinse (CPC / CETYLPYRIDINIUM CHLORIDE 0.05%) solution 7 mL  7 mL Mouth Rinse q12n4p Luz Brazen, MD      . calcium gluconate 1 g in sodium chloride 0.9 % 100 mL IVPB  1 g Intravenous Once Juanito Doom, MD      . chlorhexidine (PERIDEX) 0.12 % solution 15 mL  15 mL Mouth Rinse BID Luz Brazen, MD      . famotidine (PEPCID) IVPB 20 mg  20 mg Intravenous Q24H Colbert Coyer, MD   20 mg at 11/21/2014 2248  . fentaNYL (SUBLIMAZE) 0.05 MG/ML injection           . fentaNYL (SUBLIMAZE) injection 25 mcg  25 mcg Intravenous Q2H PRN Colbert Coyer, MD   25 mcg at 12/04/14 0506  .  fentaNYL (SUBLIMAZE) injection   Intravenous PRN Art A Hoss, MD   50 mcg at 12/04/14 1159  . heparin injection 5,000 Units  5,000 Units Subcutaneous 3 times per day Luz Brazen, MD   5,000 Units at 12/04/14 0602  . midazolam (VERSED) 2 MG/2ML injection           . midazolam (VERSED) injection   Intravenous PRN Art A Hoss, MD   1 mg at 12/04/14 1159  . naloxone [facility]) injection 0.4 mg  0.4 mg Intravenous PRN Art A Hoss, MD   0.4 mg at 12/04/14 1205  . norepinephrine (LEVOPHED) 4 mg in dextrose 5 % 250 mL (0.016 mg/mL) infusion  0-40 mcg/min Intravenous Titrated Eugenie Filler, MD 48.8 mL/hr at 12/04/14 0600 13 mcg/min at 12/04/14 0600  . ondansetron (ZOFRAN) injection 4 mg  4 mg Intravenous Q6H PRN Christina P Rama, MD      . piperacillin-tazobactam (ZOSYN) IVPB 3.375 g  3.375 g Intravenous 3 times per day Luz Brazen, MD   3.375 g at 12/04/14 0602  . sodium chloride 0.9 % injection 3 mL  3 mL Intravenous Q12H Jani Gravel, MD      . vancomycin (VANCOCIN) IVPB 1000 mg/200 mL premix  1,000 mg Intravenous QHS Luz Brazen, MD   1,000 mg at 11/30/2014 2248     No Known Allergies  BP 111/56 mmHg  Pulse 113  Temp(Src) 97.6 F (36.4 C) (Oral)  Resp 32  Ht 5\' 2"  (1.575 m)  Wt 145 lb (65.772 kg)  BMI 26.51 kg/m2  SpO2 95%  Ct Abdomen Pelvis Wo Contrast  12/01/2014   CLINICAL DATA:  Edema in the lower extremities beginning about 3 weeks ago and progressively worsening. Weakness beginning this week worsening. Diffuse abdominal pain and nausea for 2 weeks.  EXAM: CT ABDOMEN AND PELVIS WITHOUT CONTRAST  TECHNIQUE: Multidetector CT imaging of the abdomen and pelvis was performed following the standard protocol without IV contrast.  COMPARISON:  None.  FINDINGS: Small right pleural effusion with basilar atelectasis. Small pericardial effusion. Mild cardiac enlargement.  Evaluation of solid organs and vascular structures is limited without IV contrast material. Evaluation of bowel is limited without oral contrast. Loculated appearing fluid collections are demonstrated in the right upper quadrant posterior to the liver and extending inferior to the liver along the right lateral abdominal and pelvic wall. Additional fluid collections are suggested in the mesenteric. There is an air-fluid level in the central mesentery to the left which could represent a focal dilated bowel loop or an abscess. Loculated hyperdense fluid collections versus mass lesions in the pelvis. Nodular lesions in the mesentery. No free air. Small bowel are mildly dilated with diffuse wall thickening. Bladder is decompressed with mild wall thickening. I believe that the changes with most likely represent pelvic masses with diffuse peritoneal carcinomatosis and associated fluid collections. Abscess or inflammatory process are not excluded. Diffuse  enteritis is not excluded. Repeat imaging with oral and IV contrast material would be helpful to better define the abnormalities if the patient can't tolerate contrast.  Tiny subcentimeter lesion in the right lobe of the liver is indeterminate but probably represents a cyst. Gallbladder is mildly contracted. No stones or wall thickening. No bile duct dilatation. Unenhanced appearance of the spleen, pancreas, adrenal glands, kidneys, abdominal aorta, and inferior vena cava is unremarkable. Scattered retroperitoneal and mesenteric lymph nodes are not pathologically enlarged. No free air in the abdomen.  Pelvis: See above. 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 No  destructive bone lesions appreciated. Diffuse soft tissue edema.  IMPRESSION: Examination is technically limited without IV contrast material and oral contrast material. 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 Small bowel are mildly dilated with diffusely thickened wall, possibly due to tumor infiltration, reactive inflammation, or enteritis. Central abdominal abscess is not excluded. Multiple loculated fluid collections. Small right pleural effusion with basilar atelectasis. Diffuse edema.   Electronically Signed   By: Lucienne Capers M.D.   On: 11/28/2014 19:13   Dg Chest 2 View  11/30/2014   CLINICAL DATA:  Shortness of breath, leg swelling  EXAM: CHEST  2 VIEW  COMPARISON:  None.  FINDINGS: Cardiomediastinal silhouette is unremarkable. Mild degenerative changes thoracic spine. There is elevation of the right hemidiaphragm. No acute infiltrate or pulmonary edema. Mild deformity of proximal shaft of the right humerus question prior fracture.  IMPRESSION: No active disease. There is elevation of the right hemidiaphragm. Mild degenerative changes thoracic spine. Question prior fracture proximal shaft of right humerus.   Electronically Signed   By:  Lahoma Crocker M.D.   On: 12/05/2014 16:18   Ct Abdomen Pelvis W Contrast  11/24/2014   CLINICAL DATA:  Abdominal mass. Abdominal distention. 50 lb weight loss.  EXAM: CT ABDOMEN AND PELVIS WITH CONTRAST  TECHNIQUE: Multidetector CT imaging of the abdomen and pelvis was performed using the standard protocol following bolus administration of intravenous contrast.  CONTRAST:  19mL OMNIPAQUE IOHEXOL 300 MG/ML  SOLN  COMPARISON:  Noncontrast CT on 12/03/2014  FINDINGS: Lower Chest: Small right pleural effusion and right lower lobe atelectasis noted.  Hepatobiliary: 2 tiny less than 1 cm low-attenuation lesions are seen in the right and left hepatic lobes which are too small to characterize. No other liver lesions are identified. Gallbladder is unremarkable.  Pancreas: No mass, inflammatory changes, or other significant abnormality identified.  Spleen:  Within normal limits in size and appearance.  Adrenals:  No masses identified.  Kidneys/Urinary Tract:  No evidence of masses or hydronephrosis.  Stomach/Bowel/Peritoneum: Mild ascites and diffuse body wall edema are noted. Mild peritoneal enhancement seen surrounding the liver and in the lower abdomen suspicious for peritoneal carcinomatosis. Mild dilatation of small bowel loops and colon is seen, with transition point near the sigmoid colon.  A large heterogeneously enhancing mass is seen within the right lower quadrant of the abdomen and central pelvis which measures approximately 11.0 x 15.6 cm. This contains some internal gas and appears to be centered on the sigmoid colon. This could also arise from the uterus if patient has not undergone previous hysterectomy, or potentially the ovaries or mesentery.  An extraluminal collection containing both fluid and gas is seen within the small bowel mesentery to the left of midline which measures approximately 8.0 x 8.8 cm on image 55/series 2. This is consistent with abscess secondary to bowel perforation.  Vascular/Lymphatic:  Retroperitoneal lymphadenopathy seen in aorto-caval space measuring 2.4 cm on image 48/series 2. No other definite lymphadenopathy identified.  Reproductive: Not well visualized due to large pelvic mass described above.  Other:  None.  Musculoskeletal:  No suspicious bone lesions identified.  IMPRESSION: Large approximately 16 cm mass in the lower abdomen and pelvis causing distal colonic obstruction. This is suspicious for primary colon carcinoma. Differential diagnosis also includes uterine carcinoma, ovarian carcinoma, and GI stromal tumor.  9 cm extraluminal gas and fluid collection in small bowel mesentery, consistent with localized abscess secondary to bowel perforation. No  free intraperitoneal air identified.  Moderate ascites and peritoneal enhancement, suspicious for diffuse peritoneal carcinomatosis.  Mild retroperitoneal lymphadenopathy in the aortocaval space.  Two small sub-cm indeterminate liver lesions. Early liver metastases cannot definitely be excluded.  Small right pleural effusion and diffuse body wall edema.  Critical Value/emergent results were called by telephone at the time of interpretation on 12/05/2014 at 2:55 pm to the patient's floor nurse Gwenn, who verbally acknowledged these results.   Electronically Signed   By: Earle Gell M.D.   On: 11/16/2014 14:48   Dg Chest Port 1 View  12/04/2014   CLINICAL DATA:  Central line placement.  EXAM: PORTABLE CHEST - 1 VIEW  COMPARISON:  12/02/2014  FINDINGS: Left Central line is in place with the tip in the upper right atrium. No pneumothorax. Low lung volumes with bibasilar atelectasis. Heart size is borderline, accentuated by the low volumes.  IMPRESSION: Low lung volumes, bibasilar atelectasis.  Left subclavian central line tip in the upper right atrium. No pneumothorax.   Electronically Signed   By: Rolm Baptise M.D.   On: 11/24/2014 21:47   Dg Chest Port 1 View  11/23/2014   CLINICAL DATA:  Sepsis, tachycardia  EXAM: PORTABLE CHEST - 1  VIEW  COMPARISON:  11/23/2014  FINDINGS: Low lung volumes.  Right basilar opacity, likely atelectasis. No pleural effusion or pneumothorax.  Cardiomegaly.  IMPRESSION: Low lung volumes with right basilar opacity, likely atelectasis.   Electronically Signed   By: Julian Hy M.D.   On: 12/01/2014 19:45   Dg Abd Portable 1v  11/11/2014   CLINICAL DATA:  Tachycardia, sepsis. Diffuse abdominal pain, left lower quadrant pain.  EXAM: PORTABLE ABDOMEN - 1 VIEW  COMPARISON:  CT 11/19/2014  FINDINGS: Mildly dilated small bowel loops are again noted in the abdomen and pelvis. Oral contrast material noted within the stomach can small bowel loops from prior oral contrast for CT. No free air. No organomegaly.  IMPRESSION: No significant change in bowel gas pattern since earlier CT. Dilated small bowel loops again noted. No free air.   Electronically Signed   By: Rolm Baptise M.D.   On: 11/24/2014 19:53    Note: This dictation was prepared with Dragon/digital dictation along with [employer]. Any transcriptional errors that result from this process are unintentional.

## 2014-12-04 NOTE — Progress Notes (Signed)
  Echocardiogram 2D Echocardiogram has been performed.  Lysle Rubens 12/04/2014, 11:12 AM

## 2014-12-04 NOTE — Procedures (Signed)
Lower abdominal abscess drain times two.  Pus from both drains recovered.  No comp

## 2014-12-04 NOTE — Progress Notes (Signed)
eLink Physician-Brief Progress Note Patient Name: Nicole Cannon DOB: Feb 27, 1946 MRN: [id number]   Date of Service  12/04/2014  HPI/Events of Note  Patient voided spontaneously however on bladder scan had anywhere from 100 to 400 cc of urine remaining in bladder  eICU Interventions  Will insert foley catheter to allow adequate urinary drainage     Intervention Category Intermediate Interventions: Oliguria - evaluation and management  Josph Norfleet 12/04/2014, 1:31 AM

## 2014-12-04 NOTE — Progress Notes (Addendum)
Attempted to put down NG tube as ordered by Dr. Johney Maine and Dr. Halford Chessman to decompress stomach. Unable to insert NG. In effort to decompress stomach and take patient to urgent IR drain insertion procedure, inserted OG tube and removed 450 ml of green, brown bile from stomach. Dr. Barbie Banner confirmed OG tube placement in stomach in CT.  Evorn Gong, RN/BSN

## 2014-12-04 NOTE — Progress Notes (Signed)
[facility]  [address] [place], [place] [address] Phone: [phone] FAX: [phone]    Nicole Cannon [id number] 1946/10/07  CARE TEAM:  PCP: Abigail Miyamoto, MD  Outpatient Care Team: Patient Care Team: Abigail Miyamoto, MD as PCP - General (Family Medicine) Abigail Miyamoto, MD as Consulting Physician (Family Medicine)  Inpatient Treatment Team: Treatment Team: Attending Provider: Juanito Doom, MD; Consulting Physician: Nolon Nations, MD; Rounding Team: Fatima Blank, MD; Technician: Wilder Glade, NT; Registered Nurse: Vivi Martens, RN; Rounding Team: Md Pccm, MD; Registered Nurse: Nunzio Cobbs, RN; Registered Nurse: Maryelizabeth Rowan, RN  Problem List:   Principal Problem:   Severe sepsis with septic shock Active Problems:   Anasarca   Pelvic mass in female   Hypokalemia   Renal insufficiency   Protein-calorie malnutrition, severe   Bowel perforation   Abscess of gastrointestinal tract   Abdominal mass   Septic shock   Acute respiratory failure with hypoxia      Procedure(s): EXPLORATORY LAPAROTOMY  Assessment  Shock but stabilizing  Plan:  -IVF bolus -f/u Cx -drain care -vent overnight - prob extubation in AM per CCM OGT for ileus/partial colonic obstruction  -VTE prophylaxis- SCDs, etc -mobilize as tolerated to help recovery  [name], M.D., F.A.C.S. Gastrointestinal and Minimally Invasive Surgery [facility] [address] [place], [place] [address] [phone] Main / Paging   12/04/2014  Subjective:  Drains placed - purulent & tan.  No bile/succus/stool   Objective:  Vital signs:  Filed Vitals:   12/04/14 1926 12/04/14 1930 12/04/14 1945 12/04/14 1951  BP: 84/41 102/34 75/31 104/40  Pulse: 98 98 102   Temp:      TempSrc:      Resp: $Remo'27 30 30 30  'PKRDk$ Height:      Weight:      SpO2: 100% 100% 100%     Last BM Date:  11/09/2014  Intake/Output   Yesterday:  02/26 0701 - 02/27 0700 In: 3256.5 [I.V.:2906.5; IV Piggyback:350] Out: 130 [Urine:130] This shift:     Bowel function:  Flatus: n  BM: n  Drain: purulent green/tan/white  Physical Exam:  General: Pt intubated/sedated Eyes: PERRL, normal EOM.  Sclera clear.  No icterus Neuro: CN II-XII intact w/o focal sensory/motor deficits. Lymph: No head/neck/groin lymphadenopathy Psych:  No delerium/psychosis/paranoia.  Sedated HENT: Normocephalic, Mucus membranes moist.  No thrush.  OGT with bile   Neck: Supple, No tracheal deviation Chest: No chest wall pain w good excursion CV:  Pulses intact.  Regular rhythm MS: Normal AROM mjr joints.  No obvious deformity Abdomen: Soft.  Mod distended.  No evidence of peritonitis.  No incarcerated hernias. Ext:  SCDs BLE.  No mjr edema.  No cyanosis Skin: No petechiae / purpura  Results:   Labs: Results for orders placed or performed during the hospital encounter of 11/22/2014 (from the past 48 hour(s))  Magnesium     Status: None   Collection Time: 11/14/2014  5:55 AM  Result Value Ref Range   Magnesium 1.7 1.5 - 2.5 mg/dL  CEA     Status: None   Collection Time: 12/01/2014  5:55 AM  Result Value Ref Range   CEA 0.8 0.0 - 4.7 ng/mL    Comment: (NOTE)       Roche ECLIA methodology       Nonsmokers  <3.9  Smokers     <5.6 Performed At: [facility] [place], [place] [id number] Lindon Romp MD [id number]   Lactate dehydrogenase     Status: Abnormal   Collection Time: 11/25/2014  5:55 AM  Result Value Ref Range   LDH 479 (H) 94 - 250 U/L  CA 125     Status: Abnormal   Collection Time: 11/11/2014  5:55 AM  Result Value Ref Range   CA 125 403.2 (H) 0.0 - 34.0 U/mL    Comment: (NOTE) Roche ECLIA methodology Performed At: [facility] [place], [place] [id number] Lindon Romp MD [id number]   APTT     Status: None    Collection Time: 11/10/2014  5:55 AM  Result Value Ref Range   aPTT 36 24 - 37 seconds  Protime-INR     Status: Abnormal   Collection Time: 12/02/2014  5:55 AM  Result Value Ref Range   Prothrombin Time 15.9 (H) 11.6 - 15.2 seconds   INR 1.25 0.00 - 1.49  CBC with Differential/Platelet     Status: Abnormal   Collection Time: 12/05/2014  5:55 AM  Result Value Ref Range   WBC 31.5 (H) 4.0 - 10.5 K/uL   RBC 3.17 (L) 3.87 - 5.11 MIL/uL   Hemoglobin 8.3 (L) 12.0 - 15.0 g/dL   HCT 25.1 (L) 36.0 - 46.0 %   MCV 79.2 78.0 - 100.0 fL   MCH 26.2 26.0 - 34.0 pg   MCHC 33.1 30.0 - 36.0 g/dL   RDW 18.7 (H) 11.5 - 15.5 %   Platelets 388 150 - 400 K/uL   Neutrophils Relative % 96 (H) 43 - 77 %   Lymphocytes Relative 3 (L) 12 - 46 %   Monocytes Relative 1 (L) 3 - 12 %   Eosinophils Relative 0 0 - 5 %   Basophils Relative 0 0 - 1 %   Neutro Abs 30.3 (H) 1.7 - 7.7 K/uL   Lymphs Abs 0.9 0.7 - 4.0 K/uL   Monocytes Absolute 0.3 0.1 - 1.0 K/uL   Eosinophils Absolute 0.0 0.0 - 0.7 K/uL   Basophils Absolute 0.0 0.0 - 0.1 K/uL   WBC Morphology TOXIC GRANULATION     Comment: INCREASED BANDS (>20% BANDS) MILD LEFT SHIFT (1-5% METAS, OCC MYELO, OCC BANDS)   Comprehensive metabolic panel     Status: Abnormal   Collection Time: 11/24/2014  5:55 AM  Result Value Ref Range   Sodium 136 135 - 145 mmol/L   Potassium 3.3 (L) 3.5 - 5.1 mmol/L    Comment: DELTA CHECK NOTED REPEATED TO VERIFY NO VISIBLE HEMOLYSIS    Chloride 97 96 - 112 mmol/L   CO2 23 19 - 32 mmol/L   Glucose, Bld 87 70 - 99 mg/dL   BUN 37 (H) 6 - 23 mg/dL   Creatinine, Ser 1.20 (H) 0.50 - 1.10 mg/dL   Calcium 6.9 (L) 8.4 - 10.5 mg/dL   Total Protein 5.2 (L) 6.0 - 8.3 g/dL   Albumin 1.5 (L) 3.5 - 5.2 g/dL   AST 19 0 - 37 U/L   ALT 12 0 - 35 U/L   Alkaline Phosphatase 100 39 - 117 U/L   Total Bilirubin 1.4 (H) 0.3 - 1.2 mg/dL   GFR calc non Af Amer 45 (L) >90 mL/min   GFR calc Af Amer 52 (L) >90 mL/min    Comment: (NOTE) The eGFR has  been calculated using the CKD EPI equation. This calculation has  not been validated in all clinical situations. eGFR's persistently <90 mL/min signify possible Chronic Kidney Disease.    Anion gap 16 (H) 5 - 15  Vitamin B12     Status: Abnormal   Collection Time: 12/04/2014  5:55 AM  Result Value Ref Range   Vitamin B-12 1457 (H) 211 - 911 pg/mL    Comment: Performed at [facility]  Ferritin     Status: Abnormal   Collection Time: 12/01/2014  5:55 AM  Result Value Ref Range   Ferritin 374 (H) 10 - 291 ng/mL    Comment: Performed at [facility]  Iron and TIBC     Status: Abnormal   Collection Time: 11/28/2014  5:55 AM  Result Value Ref Range   Iron <10 (L) 42 - 145 ug/dL   TIBC Not calculated due to Iron <10. 250 - 470 ug/dL   Saturation Ratios Not calculated due to Iron <10. 20 - 55 %   UIBC 133 125 - 400 ug/dL    Comment: Performed at [facility]  Sedimentation rate     Status: Abnormal   Collection Time: 11/30/2014  5:55 AM  Result Value Ref Range   Sed Rate 38 (H) 0 - 22 mm/hr  Troponin I (q 6hr x 3)     Status: None   Collection Time: 12/02/2014  5:55 AM  Result Value Ref Range   Troponin I <0.03 <0.031 ng/mL    Comment:        NO INDICATION OF MYOCARDIAL INJURY.   D-dimer, quantitative     Status: Abnormal   Collection Time: 11/12/2014  5:55 AM  Result Value Ref Range   D-Dimer, Quant 13.56 (H) 0.00 - 0.48 ug/mL-FEU    Comment:        AT THE INHOUSE ESTABLISHED CUTOFF VALUE OF 0.48 ug/mL FEU, THIS ASSAY HAS BEEN DOCUMENTED IN THE LITERATURE TO HAVE A SENSITIVITY AND NEGATIVE PREDICTIVE VALUE OF AT LEAST 98 TO 99%.  THE TEST RESULT SHOULD BE CORRELATED WITH AN ASSESSMENT OF THE CLINICAL PROBABILITY OF DVT / VTE.   Surgical pcr screen     Status: None   Collection Time: 11/22/2014  5:18 PM  Result Value Ref Range   MRSA, PCR NEGATIVE NEGATIVE   Staphylococcus aureus NEGATIVE NEGATIVE    Comment:        The Xpert SA Assay (FDA approved for  NASAL specimens in patients over 63 years of age), is one component of a comprehensive surveillance program.  Test performance has been validated by [facility] for patients greater than or equal to 77 year old. It is not intended to diagnose infection nor to guide or monitor treatment.   MRSA PCR Screening     Status: None   Collection Time: 11/12/2014  8:39 PM  Result Value Ref Range   MRSA by PCR NEGATIVE NEGATIVE    Comment:        The GeneXpert MRSA Assay (FDA approved for NASAL specimens only), is one component of a comprehensive MRSA colonization surveillance program. It is not intended to diagnose MRSA infection nor to guide or monitor treatment for MRSA infections.   Fibrinogen     Status: None   Collection Time: 11/25/2014  9:38 PM  Result Value Ref Range   Fibrinogen 291 204 - 475 mg/dL  Type and screen     Status: None   Collection Time: 11/09/2014  9:41 PM  Result Value Ref Range   ABO/RH(D) O POS  Antibody Screen NEG    Sample Expiration 12/06/2014   Lactic acid, plasma     Status: Abnormal   Collection Time: 11/29/2014  9:41 PM  Result Value Ref Range   Lactic Acid, Venous 2.3 (HH) 0.5 - 2.0 mmol/L    Comment: REPEATED TO VERIFY CRITICAL RESULT CALLED TO, READ BACK BY AND VERIFIED WITH: C.AYERS,RN AT 2241 ON 11/25/2014 BY W.SHEA,MT   Troponin I (q 6hr x 3)     Status: None   Collection Time: 12/04/2014  9:55 PM  Result Value Ref Range   Troponin I <0.03 <0.031 ng/mL    Comment:        NO INDICATION OF MYOCARDIAL INJURY.   CBC with Differential     Status: Abnormal   Collection Time: 11/24/2014  9:55 PM  Result Value Ref Range   WBC 18.5 (H) 4.0 - 10.5 K/uL   RBC 3.18 (L) 3.87 - 5.11 MIL/uL   Hemoglobin 8.4 (L) 12.0 - 15.0 g/dL   HCT 25.0 (L) 36.0 - 46.0 %   MCV 78.6 78.0 - 100.0 fL   MCH 26.4 26.0 - 34.0 pg   MCHC 33.6 30.0 - 36.0 g/dL   RDW 18.3 (H) 11.5 - 15.5 %   Platelets 278 150 - 400 K/uL    Comment: DELTA CHECK NOTED SPECIMEN CHECKED FOR  CLOTS PLATELET COUNT CONFIRMED BY SMEAR    Neutrophils Relative % 95 (H) 43 - 77 %   Lymphocytes Relative 4 (L) 12 - 46 %   Monocytes Relative 1 (L) 3 - 12 %   Eosinophils Relative 0 0 - 5 %   Basophils Relative 0 0 - 1 %   Neutro Abs 17.6 (H) 1.7 - 7.7 K/uL   Lymphs Abs 0.7 0.7 - 4.0 K/uL   Monocytes Absolute 0.2 0.1 - 1.0 K/uL   Eosinophils Absolute 0.0 0.0 - 0.7 K/uL   Basophils Absolute 0.0 0.0 - 0.1 K/uL   WBC Morphology INCREASED BANDS (>20% BANDS)     Comment: MILD LEFT SHIFT (1-5% METAS, OCC MYELO, OCC BANDS) TOXIC GRANULATION   Comprehensive metabolic panel     Status: Abnormal   Collection Time: 11/13/2014  9:55 PM  Result Value Ref Range   Sodium 133 (L) 135 - 145 mmol/L   Potassium 3.7 3.5 - 5.1 mmol/L   Chloride 104 96 - 112 mmol/L   CO2 21 19 - 32 mmol/L   Glucose, Bld 75 70 - 99 mg/dL   BUN 38 (H) 6 - 23 mg/dL   Creatinine, Ser 1.16 (H) 0.50 - 1.10 mg/dL   Calcium 6.2 (LL) 8.4 - 10.5 mg/dL    Comment: REPEATED TO VERIFY CRITICAL RESULT CALLED TO, READ BACK BY AND VERIFIED WITH: C.AYERS,RN AT 2235 ON 11/13/2014 BY W.SHEA,MT    Total Protein 4.2 (L) 6.0 - 8.3 g/dL   Albumin 1.5 (L) 3.5 - 5.2 g/dL   AST 22 0 - 37 U/L   ALT 12 0 - 35 U/L   Alkaline Phosphatase 80 39 - 117 U/L   Total Bilirubin 1.3 (H) 0.3 - 1.2 mg/dL   GFR calc non Af Amer 47 (L) >90 mL/min   GFR calc Af Amer 54 (L) >90 mL/min    Comment: (NOTE) The eGFR has been calculated using the CKD EPI equation. This calculation has not been validated in all clinical situations. eGFR's persistently <90 mL/min signify possible Chronic Kidney Disease.    Anion gap 8 5 - 15  Procalcitonin     Status:  None   Collection Time: 11/23/2014  9:55 PM  Result Value Ref Range   Procalcitonin 23.62 ng/mL    Comment:        Interpretation: PCT >= 10 ng/mL: Important systemic inflammatory response, almost exclusively due to severe bacterial sepsis or septic shock. (NOTE)         ICU PCT Algorithm               Non  ICU PCT Algorithm    ----------------------------     ------------------------------         PCT < 0.25 ng/mL                 PCT < 0.1 ng/mL     Stopping of antibiotics            Stopping of antibiotics       strongly encouraged.               strongly encouraged.    ----------------------------     ------------------------------       PCT level decrease by               PCT < 0.25 ng/mL       >= 80% from peak PCT       OR PCT 0.25 - 0.5 ng/mL          Stopping of antibiotics                                             encouraged.     Stopping of antibiotics           encouraged.    ----------------------------     ------------------------------       PCT level decrease by              PCT >= 0.25 ng/mL       < 80% from peak PCT        AND PCT >= 0.5 ng/mL             Continuing antibiotics                                              encouraged.       Continuing antibiotics            encouraged.    ----------------------------     ------------------------------     PCT level increase compared          PCT > 0.5 ng/mL         with peak PCT AND          PCT >= 0.5 ng/mL             Escalation of antibiotics                                          strongly encouraged.      Escalation of antibiotics        strongly encouraged.   Protime-INR     Status: Abnormal   Collection Time: 11/13/2014  9:55 PM  Result Value Ref Range   Prothrombin Time 16.7 (H) 11.6 -  15.2 seconds   INR 1.34 0.00 - 1.49  APTT     Status: None   Collection Time: 11/22/2014  9:55 PM  Result Value Ref Range   aPTT 36 24 - 37 seconds  ABO/Rh     Status: None   Collection Time: 11/18/2014  9:55 PM  Result Value Ref Range   ABO/RH(D) O POS   Cortisol     Status: None   Collection Time: 07-Dec-2014  4:00 AM  Result Value Ref Range   Cortisol, Plasma >150.0 ug/dL    Comment: (NOTE) Result repeated and verified. Result confirmed by automatic dilution. AM:  4.3 - 22.4 ug/dL PM:  3.1 - 16.7 ug/dL Performed at  [facility]   Blood gas, arterial     Status: Abnormal   Collection Time: 2014/12/07  3:04 PM  Result Value Ref Range   FIO2 1.00 %   Delivery systems VENTILATOR    Mode PRESSURE REGULATED VOLUME CONTROL    VT 400 mL   Rate 14 resp/min   Peep/cpap 5.0 cm H20   pH, Arterial 7.341 (L) 7.350 - 7.450   pCO2 arterial 36.6 35.0 - 45.0 mmHg   pO2, Arterial 299.0 (H) 80.0 - 100.0 mmHg   Bicarbonate 19.3 (L) 20.0 - 24.0 mEq/L   TCO2 18.6 0 - 100 mmol/L   Acid-base deficit 5.4 (H) 0.0 - 2.0 mmol/L   O2 Saturation 99.0 %   Patient temperature 98.6    Collection site RIGHT RADIAL    Drawn by COLLECTED BY RT    Sample type ARTERIAL DRAW    Allens test (pass/fail) PASS PASS  Lactic acid, plasma     Status: Abnormal   Collection Time: 12-07-14  6:30 PM  Result Value Ref Range   Lactic Acid, Venous 2.1 (HH) 0.5 - 2.0 mmol/L    Comment: REPEATED TO VERIFY CRITICAL RESULT CALLED TO, READ BACK BY AND VERIFIED WITH: Bonnita Nasuti RN 1934 12-07-14 A NAVARRO Performed at [facility]     Imaging / Studies: Ct Abdomen Wo Contrast  12/07/14   CLINICAL DATA:  Abscess  EXAM: CT ABDOMEN WITHOUT CONTRAST  TECHNIQUE: Multidetector CT imaging of the abdomen was performed following the standard protocol without IV contrast.  COMPARISON:  Yesterday  FINDINGS: The patient is in the prone position.  The abscess containing gas and fluid in the mesenteric on image 47 is stable.  The large mass filling the pelvis on image 66 is stable. Fluid in the right pericolic gutter is stable.  There are dilated small bowel loops filled with contrast compatible with an element of obstruction or ileus.  Exam is otherwise stable.  IMPRESSION: Multiple abscess sees and pelvic mass are stable.  Ileus versus small bowel obstruction pattern.   Electronically Signed   By: Marybelle Killings M.D.   On: Dec 07, 2014 13:40   Ct Angio Chest Pe W/cm &/or Wo Cm  December 07, 2014   CLINICAL DATA:  Elevated D-dimer level.  Shallow breathing.   EXAM: CT ANGIOGRAPHY CHEST WITH CONTRAST  TECHNIQUE: Multidetector CT imaging of the chest was performed using the standard protocol during bolus administration of intravenous contrast. Multiplanar CT image reconstructions and MIPs were obtained to evaluate the vascular anatomy.  CONTRAST:  That information is not available and could not be obtained. Attempts to contact CT technologist who performed exam were unsuccessful.  COMPARISON:  Chest radiograph of December 03, 2014.  FINDINGS: No pneumothorax is noted. Moderate bilateral pleural effusions are noted with adjacent subsegmental atelectasis,  right greater than left. There is no evidence of pulmonary embolus. There is no evidence of thoracic aortic dissection or aneurysm. No mediastinal mass or adenopathy is noted. Moderate anasarca is noted. Moderate to severe dilatation of the esophagus is noted concerning for gastroesophageal junction stenosis. No significant osseous abnormality is noted. Ascites is noted in the visualized portion of the upper abdomen.  Review of the MIP images confirms the above findings.  IMPRESSION: No evidence of pulmonary embolus.  Moderate bilateral pleural effusions are noted with adjacent subsegmental atelectasis.  Moderate anasarca is noted.  Moderate to severe dilatation of the esophagus is noted concerning for distal esophageal narrowing or stricture.  Mild ascites is noted in the visualized portion the upper abdomen.   Electronically Signed   By: Marijo Conception, M.D.   On: 12/04/2014 14:10   Ct Abdomen Pelvis W Contrast  12/02/2014   CLINICAL DATA:  Abdominal mass. Abdominal distention. 50 lb weight loss.  EXAM: CT ABDOMEN AND PELVIS WITH CONTRAST  TECHNIQUE: Multidetector CT imaging of the abdomen and pelvis was performed using the standard protocol following bolus administration of intravenous contrast.  CONTRAST:  32mL OMNIPAQUE IOHEXOL 300 MG/ML  SOLN  COMPARISON:  Noncontrast CT on 11/16/2014  FINDINGS: Lower Chest: Small  right pleural effusion and right lower lobe atelectasis noted.  Hepatobiliary: 2 tiny less than 1 cm low-attenuation lesions are seen in the right and left hepatic lobes which are too small to characterize. No other liver lesions are identified. Gallbladder is unremarkable.  Pancreas: No mass, inflammatory changes, or other significant abnormality identified.  Spleen:  Within normal limits in size and appearance.  Adrenals:  No masses identified.  Kidneys/Urinary Tract:  No evidence of masses or hydronephrosis.  Stomach/Bowel/Peritoneum: Mild ascites and diffuse body wall edema are noted. Mild peritoneal enhancement seen surrounding the liver and in the lower abdomen suspicious for peritoneal carcinomatosis. Mild dilatation of small bowel loops and colon is seen, with transition point near the sigmoid colon.  A large heterogeneously enhancing mass is seen within the right lower quadrant of the abdomen and central pelvis which measures approximately 11.0 x 15.6 cm. This contains some internal gas and appears to be centered on the sigmoid colon. This could also arise from the uterus if patient has not undergone previous hysterectomy, or potentially the ovaries or mesentery.  An extraluminal collection containing both fluid and gas is seen within the small bowel mesentery to the left of midline which measures approximately 8.0 x 8.8 cm on image 55/series 2. This is consistent with abscess secondary to bowel perforation.  Vascular/Lymphatic: Retroperitoneal lymphadenopathy seen in aorto-caval space measuring 2.4 cm on image 48/series 2. No other definite lymphadenopathy identified.  Reproductive: Not well visualized due to large pelvic mass described above.  Other:  None.  Musculoskeletal:  No suspicious bone lesions identified.  IMPRESSION: Large approximately 16 cm mass in the lower abdomen and pelvis causing distal colonic obstruction. This is suspicious for primary colon carcinoma. Differential diagnosis also  includes uterine carcinoma, ovarian carcinoma, and GI stromal tumor.  9 cm extraluminal gas and fluid collection in small bowel mesentery, consistent with localized abscess secondary to bowel perforation. No free intraperitoneal air identified.  Moderate ascites and peritoneal enhancement, suspicious for diffuse peritoneal carcinomatosis.  Mild retroperitoneal lymphadenopathy in the aortocaval space.  Two small sub-cm indeterminate liver lesions. Early liver metastases cannot definitely be excluded.  Small right pleural effusion and diffuse body wall edema.  Critical Value/emergent results were called by telephone at  the time of interpretation on 11/28/2014 at 2:55 pm to the patient's floor nurse Gwenn, who verbally acknowledged these results.   Electronically Signed   By: Earle Gell M.D.   On: 11/18/2014 14:48   Dg Chest Port 1 View  12/04/2014   CLINICAL DATA:  Intubated.  EXAM: PORTABLE CHEST - 1 VIEW  COMPARISON:  11/17/2014  FINDINGS: Endotracheal tube is 2 cm above the carina. Left subclavian central line tip is in the upper right atrium, stable. Worsening aeration in the lungs with increasing bibasilar opacities, likely atelectasis. Decreasing lung volumes. Small right pleural effusion.  IMPRESSION: Endotracheal tube tip 2 cm above the carina.  Increasing bibasilar atelectasis.  Small right effusion.   Electronically Signed   By: Rolm Baptise M.D.   On: 12/04/2014 15:16   Dg Chest Port 1 View  11/17/2014   CLINICAL DATA:  Central line placement.  EXAM: PORTABLE CHEST - 1 VIEW  COMPARISON:  11/09/2014  FINDINGS: Left Central line is in place with the tip in the upper right atrium. No pneumothorax. Low lung volumes with bibasilar atelectasis. Heart size is borderline, accentuated by the low volumes.  IMPRESSION: Low lung volumes, bibasilar atelectasis.  Left subclavian central line tip in the upper right atrium. No pneumothorax.   Electronically Signed   By: Rolm Baptise M.D.   On: 12/05/2014 21:47   Dg  Chest Port 1 View  12/04/2014   CLINICAL DATA:  Sepsis, tachycardia  EXAM: PORTABLE CHEST - 1 VIEW  COMPARISON:  11/15/2014  FINDINGS: Low lung volumes.  Right basilar opacity, likely atelectasis. No pleural effusion or pneumothorax.  Cardiomegaly.  IMPRESSION: Low lung volumes with right basilar opacity, likely atelectasis.   Electronically Signed   By: Julian Hy M.D.   On: 11/16/2014 19:45   Dg Abd Portable 1v  11/30/2014   CLINICAL DATA:  Tachycardia, sepsis. Diffuse abdominal pain, left lower quadrant pain.  EXAM: PORTABLE ABDOMEN - 1 VIEW  COMPARISON:  CT 11/18/2014  FINDINGS: Mildly dilated small bowel loops are again noted in the abdomen and pelvis. Oral contrast material noted within the stomach can small bowel loops from prior oral contrast for CT. No free air. No organomegaly.  IMPRESSION: No significant change in bowel gas pattern since earlier CT. Dilated small bowel loops again noted. No free air.   Electronically Signed   By: Rolm Baptise M.D.   On: 11/19/2014 19:53    Medications / Allergies: per chart  Antibiotics: Anti-infectives    Start     Dose/Rate Route Frequency Ordered Stop   11/24/2014 2230  vancomycin (VANCOCIN) IVPB 1000 mg/200 mL premix     1,000 mg 200 mL/hr over 60 Minutes Intravenous Daily at bedtime 11/24/2014 2145     12/05/2014 2200  piperacillin-tazobactam (ZOSYN) IVPB 3.375 g  Status:  Discontinued     3.375 g 100 mL/hr over 30 Minutes Intravenous  Once 11/11/2014 2145 11/22/2014 2149   12/02/2014 2200  piperacillin-tazobactam (ZOSYN) IVPB 3.375 g     3.375 g 12.5 mL/hr over 240 Minutes Intravenous 3 times per day 11/13/2014 2146     12/01/2014 1915  piperacillin-tazobactam (ZOSYN) IVPB 3.375 g  Status:  Discontinued     3.375 g 100 mL/hr over 30 Minutes Intravenous  Once 11/24/2014 1913 12/02/2014 1918   11/08/2014 1800  ertapenem (INVANZ) 1 g in sodium chloride 0.9 % 50 mL IVPB  Status:  Discontinued     1 g 100 mL/hr over 30 Minutes Intravenous Every 24 hours  11/23/2014 1542 11/12/2014 2145   12/02/2014 0400  piperacillin-tazobactam (ZOSYN) IVPB 3.375 g  Status:  Discontinued     3.375 g 12.5 mL/hr over 240 Minutes Intravenous Every 8 hours 11/28/2014 2337 11/14/2014 1542   11/26/2014 2000  piperacillin-tazobactam (ZOSYN) IVPB 3.375 g     3.375 g 12.5 mL/hr over 240 Minutes Intravenous  Once 11/30/2014 1952 11/25/2014 2245       Note: Portions of this report may have been transcribed using voice recognition software. Every effort was made to ensure accuracy; however, inadvertent computerized transcription errors may be present.   Any transcriptional errors that result from this process are unintentional.     [name], M.D., F.A.C.S. Gastrointestinal and Minimally Invasive Surgery [facility] [address] [place], [place] [address] [phone] Main / Paging   12/04/2014

## 2014-12-04 NOTE — Progress Notes (Signed)
[facility] Progress Note Patient Name: Nicole Cannon DOB: 1946/06/07 MRN: [id number]   Date of Service  12/04/2014  HPI/Events of Note  Pain  eICU Interventions  PRN order for fentanyl 25 mcg IV q2 hours prn pain     Intervention Category Intermediate Interventions: Pain - evaluation and management  DETERDING,ELIZABETH 12/04/2014, 4:17 AM

## 2014-12-04 NOTE — Progress Notes (Signed)
Patient ID: Nicole Cannon, female   DOB: [birth year], 69 y.o.   MRN: [id number]    Subjective: Patient is alert and reasonably comfortable this morning. Has some abdominal pain with moving but denies pain at rest.  Objective: Vital signs in last 24 hours: Temp:  [97.2 F (36.2 C)-97.6 F (36.4 C)] 97.6 F (36.4 C) (02/27 0330) Pulse Rate:  [106-117] 111 (02/27 0600) Resp:  [22-43] 35 (02/27 0600) BP: (75-135)/(23-89) 112/47 mmHg (02/27 0630) SpO2:  [89 %-100 %] 97 % (02/27 0600) Last BM Date: 11/09/2014  Intake/Output from previous day: 02/26 0701 - 02/27 0700 In: 3256.5 [I.V.:2906.5; IV Piggyback:350] Out: 130 [Urine:130] Intake/Output this shift:    General appearance: alert, cooperative, no distress and chronically ill-appearing Resp: clear to auscultation bilaterally and with very mild increased work of breathing GI: distended and firm. Mild tenderness without peritoneal signs.  Lab Results:   Recent Labs  11/26/2014 0555 11/11/2014 2155  WBC 31.5* 18.5*  HGB 8.3* 8.4*  HCT 25.1* 25.0*  PLT 388 278   BMET  Recent Labs  11/26/2014 0555 11/30/2014 2155  NA 136 133*  K 3.3* 3.7  CL 97 104  CO2 23 21  GLUCOSE 87 75  BUN 37* 38*  CREATININE 1.20* 1.16*  CALCIUM 6.9* 6.2*     Studies/Results: Ct Abdomen Pelvis Wo Contrast  11/17/2014   CLINICAL DATA:  Edema in the lower extremities beginning about 3 weeks ago and progressively worsening. Weakness beginning this week worsening. Diffuse abdominal pain and nausea for 2 weeks.  EXAM: CT ABDOMEN AND PELVIS WITHOUT CONTRAST  TECHNIQUE: Multidetector CT imaging of the abdomen and pelvis was performed following the standard protocol without IV contrast.  COMPARISON:  None.  FINDINGS: Small right pleural effusion with basilar atelectasis. Small pericardial effusion. Mild cardiac enlargement.  Evaluation of solid organs and vascular structures is limited without IV contrast material. Evaluation of bowel is limited without oral  contrast. Loculated appearing fluid collections are demonstrated in the right upper quadrant posterior to the liver and extending inferior to the liver along the right lateral abdominal and pelvic wall. Additional fluid collections are suggested in the mesenteric. There is an air-fluid level in the central mesentery to the left which could represent a focal dilated bowel loop or an abscess. Loculated hyperdense fluid collections versus mass lesions in the pelvis. Nodular lesions in the mesentery. No free air. Small bowel are mildly dilated with diffuse wall thickening. Bladder is decompressed with mild wall thickening. I believe that the changes with most likely represent pelvic masses with diffuse peritoneal carcinomatosis and associated fluid collections. Abscess or inflammatory process are not excluded. Diffuse enteritis is not excluded. Repeat imaging with oral and IV contrast material would be helpful to better define the abnormalities if the patient can't tolerate contrast.  Tiny subcentimeter lesion in the right lobe of the liver is indeterminate but probably represents a cyst. Gallbladder is mildly contracted. No stones or wall thickening. No bile duct dilatation. Unenhanced appearance of the spleen, pancreas, adrenal glands, kidneys, abdominal aorta, and inferior vena cava is unremarkable. Scattered retroperitoneal and mesenteric lymph nodes are not pathologically enlarged. No free air in the abdomen.  Pelvis: See above. 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 No destructive bone lesions appreciated. Diffuse soft tissue edema.  IMPRESSION: Examination is technically limited without IV contrast material and oral contrast material. 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 Small bowel are mildly dilated with diffusely  thickened wall, possibly due to tumor infiltration, reactive inflammation, or enteritis.  Central abdominal abscess is not excluded. Multiple loculated fluid collections. Small right pleural effusion with basilar atelectasis. Diffuse edema.   Electronically Signed   By: Lucienne Capers M.D.   On: 11/29/2014 19:13   Dg Chest 2 View  11/17/2014   CLINICAL DATA:  Shortness of breath, leg swelling  EXAM: CHEST  2 VIEW  COMPARISON:  None.  FINDINGS: Cardiomediastinal silhouette is unremarkable. Mild degenerative changes thoracic spine. There is elevation of the right hemidiaphragm. No acute infiltrate or pulmonary edema. Mild deformity of proximal shaft of the right humerus question prior fracture.  IMPRESSION: No active disease. There is elevation of the right hemidiaphragm. Mild degenerative changes thoracic spine. Question prior fracture proximal shaft of right humerus.   Electronically Signed   By: Lahoma Crocker M.D.   On: 11/19/2014 16:18   Ct Abdomen Pelvis W Contrast  11/25/2014   CLINICAL DATA:  Abdominal mass. Abdominal distention. 50 lb weight loss.  EXAM: CT ABDOMEN AND PELVIS WITH CONTRAST  TECHNIQUE: Multidetector CT imaging of the abdomen and pelvis was performed using the standard protocol following bolus administration of intravenous contrast.  CONTRAST:  84mL OMNIPAQUE IOHEXOL 300 MG/ML  SOLN  COMPARISON:  Noncontrast CT on 11/10/2014  FINDINGS: Lower Chest: Small right pleural effusion and right lower lobe atelectasis noted.  Hepatobiliary: 2 tiny less than 1 cm low-attenuation lesions are seen in the right and left hepatic lobes which are too small to characterize. No other liver lesions are identified. Gallbladder is unremarkable.  Pancreas: No mass, inflammatory changes, or other significant abnormality identified.  Spleen:  Within normal limits in size and appearance.  Adrenals:  No masses identified.  Kidneys/Urinary Tract:  No evidence of masses or hydronephrosis.  Stomach/Bowel/Peritoneum: Mild ascites and diffuse body wall edema are noted. Mild peritoneal enhancement seen  surrounding the liver and in the lower abdomen suspicious for peritoneal carcinomatosis. Mild dilatation of small bowel loops and colon is seen, with transition point near the sigmoid colon.  A large heterogeneously enhancing mass is seen within the right lower quadrant of the abdomen and central pelvis which measures approximately 11.0 x 15.6 cm. This contains some internal gas and appears to be centered on the sigmoid colon. This could also arise from the uterus if patient has not undergone previous hysterectomy, or potentially the ovaries or mesentery.  An extraluminal collection containing both fluid and gas is seen within the small bowel mesentery to the left of midline which measures approximately 8.0 x 8.8 cm on image 55/series 2. This is consistent with abscess secondary to bowel perforation.  Vascular/Lymphatic: Retroperitoneal lymphadenopathy seen in aorto-caval space measuring 2.4 cm on image 48/series 2. No other definite lymphadenopathy identified.  Reproductive: Not well visualized due to large pelvic mass described above.  Other:  None.  Musculoskeletal:  No suspicious bone lesions identified.  IMPRESSION: Large approximately 16 cm mass in the lower abdomen and pelvis causing distal colonic obstruction. This is suspicious for primary colon carcinoma. Differential diagnosis also includes uterine carcinoma, ovarian carcinoma, and GI stromal tumor.  9 cm extraluminal gas and fluid collection in small bowel mesentery, consistent with localized abscess secondary to bowel perforation. No free intraperitoneal air identified.  Moderate ascites and peritoneal enhancement, suspicious for diffuse peritoneal carcinomatosis.  Mild retroperitoneal lymphadenopathy in the aortocaval space.  Two small sub-cm indeterminate liver lesions. Early liver metastases cannot definitely be excluded.  Small right pleural effusion and diffuse body wall edema.  Critical Value/emergent results were called by telephone at the time  of interpretation on 11/12/2014 at 2:55 pm to the patient's floor nurse Gwenn, who verbally acknowledged these results.   Electronically Signed   By: Earle Gell M.D.   On: 11/30/2014 14:48   Dg Chest Port 1 View  11/17/2014   CLINICAL DATA:  Central line placement.  EXAM: PORTABLE CHEST - 1 VIEW  COMPARISON:  11/26/2014  FINDINGS: Left Central line is in place with the tip in the upper right atrium. No pneumothorax. Low lung volumes with bibasilar atelectasis. Heart size is borderline, accentuated by the low volumes.  IMPRESSION: Low lung volumes, bibasilar atelectasis.  Left subclavian central line tip in the upper right atrium. No pneumothorax.   Electronically Signed   By: Rolm Baptise M.D.   On: 11/15/2014 21:47   Dg Chest Port 1 View  11/22/2014   CLINICAL DATA:  Sepsis, tachycardia  EXAM: PORTABLE CHEST - 1 VIEW  COMPARISON:  11/21/2014  FINDINGS: Low lung volumes.  Right basilar opacity, likely atelectasis. No pleural effusion or pneumothorax.  Cardiomegaly.  IMPRESSION: Low lung volumes with right basilar opacity, likely atelectasis.   Electronically Signed   By: Julian Hy M.D.   On: 11/25/2014 19:45   Dg Abd Portable 1v  11/10/2014   CLINICAL DATA:  Tachycardia, sepsis. Diffuse abdominal pain, left lower quadrant pain.  EXAM: PORTABLE ABDOMEN - 1 VIEW  COMPARISON:  CT 11/23/2014  FINDINGS: Mildly dilated small bowel loops are again noted in the abdomen and pelvis. Oral contrast material noted within the stomach can small bowel loops from prior oral contrast for CT. No free air. No organomegaly.  IMPRESSION: No significant change in bowel gas pattern since earlier CT. Dilated small bowel loops again noted. No free air.   Electronically Signed   By: Rolm Baptise M.D.   On: 11/30/2014 19:53    Anti-infectives: Anti-infectives    Start     Dose/Rate Route Frequency Ordered Stop   12/02/2014 2230  vancomycin (VANCOCIN) IVPB 1000 mg/200 mL premix     1,000 mg 200 mL/hr over 60 Minutes  Intravenous Daily at bedtime 11/29/2014 2145     11/24/2014 2200  piperacillin-tazobactam (ZOSYN) IVPB 3.375 g  Status:  Discontinued     3.375 g 100 mL/hr over 30 Minutes Intravenous  Once 11/16/2014 2145 11/18/2014 2149   11/16/2014 2200  piperacillin-tazobactam (ZOSYN) IVPB 3.375 g     3.375 g 12.5 mL/hr over 240 Minutes Intravenous 3 times per day 11/22/2014 2146     11/27/2014 1915  piperacillin-tazobactam (ZOSYN) IVPB 3.375 g  Status:  Discontinued     3.375 g 100 mL/hr over 30 Minutes Intravenous  Once 11/24/2014 1913 11/13/2014 1918   12/04/2014 1800  ertapenem (INVANZ) 1 g in sodium chloride 0.9 % 50 mL IVPB  Status:  Discontinued     1 g 100 mL/hr over 30 Minutes Intravenous Every 24 hours 12/05/2014 1542 12/02/2014 2145   12/02/2014 0400  piperacillin-tazobactam (ZOSYN) IVPB 3.375 g  Status:  Discontinued     3.375 g 12.5 mL/hr over 240 Minutes Intravenous Every 8 hours 11/16/2014 2337 11/16/2014 1542   11/25/2014 2000  piperacillin-tazobactam (ZOSYN) IVPB 3.375 g     3.375 g 12.5 mL/hr over 240 Minutes Intravenous  Once 11/28/2014 1952 11/09/2014 2245      Assessment/Plan: Large pelvic tumor of uncertain source, 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 Partial colonic obstruction and central abdominal abscess. Question bowel perforation although no contrast leak.  Patient is more stable this morning with improved vital signs after resuscitation, improved renal function and decreased white blood count. Still would be very high surgical risk. Awaiting interventional radiology opinion on percutaneous drainage which I think would be the best initial option if possible. If this is not possible then she likely will need laparotomy. Discussed with the patient.    LOS: 2 days    Shondale Quinley T 12/04/2014

## 2014-12-04 NOTE — Progress Notes (Signed)
Patient ID: Nicole Cannon, female   DOB: [birth year], 69 y.o.   MRN: [id number]  [name] has a large pelvic mass and at least two abd abscesses. She may have abscess in her pelvis about the mass. We will plan to place abd abscess drain times two ASAP.

## 2014-12-04 NOTE — Progress Notes (Signed)
Patient being assisted to the bathroom by NT and had decreased responsiveness.Patient transferred to bed with the assistance of 2 nurses and nurse tech. Rapid response called for further evaluation. Rapid response arrived to room, and assisted with further assessment of  Patient. Vitals assessed, patient positive for orthostatic vitals, rales  noted in lower lungs, and patient experiencing increased effort in breathing. O2 at 3L administered. Patient became more responsive. Attending and surgery notified of patient condition. Orders given by Attending to transfer patient to ICU. Patient transferred to ICU, patient son [name] also notified of patient transfer.

## 2014-12-04 NOTE — Progress Notes (Signed)
Chaplain follow-up  Ms Nicole Cannon is at peace after speaking with physicians and being told a procedure will be done to retry drainage. She feels her prayers were answered.   Again Ms Nicole Cannon spoke highly about the excellent and compassionate care given to her by nurses. She relates it makes everything easier.  Chaplains are available at all times should Ms Nicole Cannon need or request further spiritual care.  Sallee Lange. Eria Lozoya, [place]

## 2014-12-04 NOTE — Procedures (Signed)
Intubation Procedure Note Nicole Cannon [id number] [birth year]  Procedure: Intubation Indications: Airway protection and maintenance  Procedure Details Consent: Risks of procedure as well as the alternatives and risks of each were explained to the (patient/caregiver).  Consent for procedure obtained. Time Out: Verified patient identification, verified procedure, site/side was marked, verified correct patient position, special equipment/implants available, medications/allergies/relevent history reviewed, required imaging and test results available.  Performed  Maximum sterile technique was used including gloves and hand hygiene.  MAC and 3  69 yo female with abdominal abscess.  She needs to have drain placed by IR, but requires airway protection for procedure.  She was given 2 mg versed, 50 mcg fentanyl, and 10 mg etomidate.    Inserted 7.5 ETT to 21 cm at lip.  Breath sounds b/l and good color change on CO2 detector.  Used glidescope.  Evaluation Hemodynamic Status: BP stable throughout; O2 sats: stable throughout Patient's Current Condition: stable Complications: No apparent complications Patient did tolerate procedure well. Chest X-ray ordered to verify placement.  CXR: pending.   [name], MD [facility] Pulmonary/Critical Care 12/04/2014, 2:53 PM Pager:  [phone] After 3pm call: [phone]

## 2014-12-04 NOTE — Sedation Documentation (Addendum)
Dr. Barbie Banner, Carron Brazen, RN, Amy Jake Michaelis, RTR, Evorn Gong, RN at bedside. Time out performed and sedation began at 1159.  Dr. Barbie Banner wanted to start with 1mg  Versed and 20mcg of Fentanyl based on patients vitals. Medication was administed and patient had adverse reaction to 1mg  Versed and 70mcg Fentanyl at 1201. Patient was in prone position. Patient vomited small amount of yellowish brown bile and became unresponsive at 1201. Code blue called to get help as quickly as possible. Vital signs were checked at 1203: BP 78/51, HR 113, O2 95% on 4L, RR 32. Orders were given to administer 0.1mg  Flumazenil, and 0.4mg  of Narcan. 0.1mg  Flumazenil and 0.4mg  Narcan was administered at 1205. Patient immediately became alert. Vital signs were checked every 5 minutes. One patient was stabilized patient was placed back in supine position on CT table. Code blue was canceled. Dr. Barbie Banner wanted patient to stay on CT table for CT chest with contrast media to rule out PE. Due to patients condition initial procedure was not performed. Dr. Barbie Banner decided that the risk out weighed benefits.

## 2014-12-05 ENCOUNTER — Inpatient Hospital Stay (HOSPITAL_COMMUNITY): Payer: Commercial Managed Care - HMO

## 2014-12-05 DIAGNOSIS — K631 Perforation of intestine (nontraumatic): Secondary | ICD-10-CM

## 2014-12-05 LAB — GLUCOSE, CAPILLARY
GLUCOSE-CAPILLARY: 128 mg/dL — AB (ref 70–99)
GLUCOSE-CAPILLARY: 144 mg/dL — AB (ref 70–99)
GLUCOSE-CAPILLARY: 96 mg/dL (ref 70–99)
Glucose-Capillary: 106 mg/dL — ABNORMAL HIGH (ref 70–99)
Glucose-Capillary: 112 mg/dL — ABNORMAL HIGH (ref 70–99)
Glucose-Capillary: 129 mg/dL — ABNORMAL HIGH (ref 70–99)
Glucose-Capillary: 142 mg/dL — ABNORMAL HIGH (ref 70–99)
Glucose-Capillary: 93 mg/dL (ref 70–99)

## 2014-12-05 LAB — CBC WITH DIFFERENTIAL/PLATELET
BASOS PCT: 0 % (ref 0–1)
Basophils Absolute: 0 10*3/uL (ref 0.0–0.1)
EOS PCT: 0 % (ref 0–5)
Eosinophils Absolute: 0 10*3/uL (ref 0.0–0.7)
HCT: 21.9 % — ABNORMAL LOW (ref 36.0–46.0)
HEMOGLOBIN: 7.4 g/dL — AB (ref 12.0–15.0)
LYMPHS PCT: 5 % — AB (ref 12–46)
Lymphs Abs: 1.1 10*3/uL (ref 0.7–4.0)
MCH: 26.4 pg (ref 26.0–34.0)
MCHC: 33.8 g/dL (ref 30.0–36.0)
MCV: 78.2 fL (ref 78.0–100.0)
MONO ABS: 0.2 10*3/uL (ref 0.1–1.0)
Monocytes Relative: 1 % — ABNORMAL LOW (ref 3–12)
Neutro Abs: 21.1 10*3/uL — ABNORMAL HIGH (ref 1.7–7.7)
Neutrophils Relative %: 94 % — ABNORMAL HIGH (ref 43–77)
Platelets: 39 10*3/uL — ABNORMAL LOW (ref 150–400)
RBC: 2.8 MIL/uL — AB (ref 3.87–5.11)
RDW: 18.7 % — AB (ref 11.5–15.5)
WBC: 22.4 10*3/uL — ABNORMAL HIGH (ref 4.0–10.5)

## 2014-12-05 LAB — DIC (DISSEMINATED INTRAVASCULAR COAGULATION)PANEL
D-Dimer, Quant: 14.47 ug/mL-FEU — ABNORMAL HIGH (ref 0.00–0.48)
INR: 1.31 (ref 0.00–1.49)
Platelets: 28 10*3/uL — CL (ref 150–400)
Prothrombin Time: 16.4 seconds — ABNORMAL HIGH (ref 11.6–15.2)
Smear Review: NONE SEEN

## 2014-12-05 LAB — BASIC METABOLIC PANEL
Anion gap: 11 (ref 5–15)
BUN: 45 mg/dL — ABNORMAL HIGH (ref 6–23)
CO2: 20 mmol/L (ref 19–32)
Calcium: 6.1 mg/dL — CL (ref 8.4–10.5)
Chloride: 97 mmol/L (ref 96–112)
Creatinine, Ser: 1.3 mg/dL — ABNORMAL HIGH (ref 0.50–1.10)
GFR calc non Af Amer: 41 mL/min — ABNORMAL LOW (ref >90)
GFR, EST AFRICAN AMERICAN: 47 mL/min — AB (ref >90)
GLUCOSE: 150 mg/dL — AB (ref 70–99)
POTASSIUM: 3.7 mmol/L (ref 3.5–5.1)
Sodium: 128 mmol/L — ABNORMAL LOW (ref 135–145)

## 2014-12-05 LAB — DIC (DISSEMINATED INTRAVASCULAR COAGULATION) PANEL
FIBRINOGEN: 228 mg/dL (ref 204–475)
aPTT: 40 seconds — ABNORMAL HIGH (ref 24–37)

## 2014-12-05 MED ORDER — INSULIN ASPART 100 UNIT/ML ~~LOC~~ SOLN
0.0000 [IU] | SUBCUTANEOUS | Status: DC
  Administered 2014-12-05 – 2014-12-07 (×4): 1 [IU] via SUBCUTANEOUS
  Administered 2014-12-08: 2 [IU] via SUBCUTANEOUS

## 2014-12-05 MED ORDER — PROPOFOL 10 MG/ML IV EMUL
INTRAVENOUS | Status: AC
  Filled 2014-12-05: qty 100

## 2014-12-05 MED ORDER — VITAL HIGH PROTEIN PO LIQD
1000.0000 mL | ORAL | Status: DC
  Filled 2014-12-05: qty 1000

## 2014-12-05 MED ORDER — SODIUM CHLORIDE 0.9 % IV SOLN
25.0000 ug/h | INTRAVENOUS | Status: DC
  Administered 2014-12-06: 200 ug/h via INTRAVENOUS
  Administered 2014-12-06: 150 ug/h via INTRAVENOUS
  Administered 2014-12-07: 200 ug/h via INTRAVENOUS
  Administered 2014-12-07: 150 ug/h via INTRAVENOUS
  Administered 2014-12-08: 75 ug/h via INTRAVENOUS
  Filled 2014-12-05 (×6): qty 50

## 2014-12-05 MED ORDER — SODIUM CHLORIDE 0.9 % IV SOLN
2.0000 g | Freq: Once | INTRAVENOUS | Status: AC
  Administered 2014-12-05: 2 g via INTRAVENOUS
  Filled 2014-12-05: qty 20

## 2014-12-05 MED ORDER — VITAL HIGH PROTEIN PO LIQD
1000.0000 mL | ORAL | Status: DC
Start: 2014-12-05 — End: 2014-12-05
  Administered 2014-12-05: 1000 mL
  Filled 2014-12-05: qty 1000

## 2014-12-05 MED ORDER — VITAL HIGH PROTEIN PO LIQD: 1000.0000 mL | ORAL | Status: DC

## 2014-12-05 NOTE — Progress Notes (Signed)
Patient has two drains, one on the left, and one on the right.  The drain on the left draining purulent drainage.  The drain on the right is brown/purulent and smells like stool when the drain is emptied.  Dr. Lake Bells aware of the appearance of the drainage.  Continue to monitor closely.  Dallas Torok Roselie Awkward RN

## 2014-12-05 NOTE — Progress Notes (Signed)
Patient ID: Smt. Loder, female   DOB: Feb 21, 1946, 69 y.o.   MRN: [id number]    Subjective: Patient intubated yesterday prior to drain placement as she did not tolerate prone position. Had successful placement of drains in the 2 largest fluid collections obtaining purulent nonbilious material. Patient has remained intubated. Currently sedated on vent.  Objective: Vital signs in last 24 hours: Temp:  [97.5 F (36.4 C)-98.2 F (36.8 C)] 97.9 F (36.6 C) (02/28 0400) Pulse Rate:  [90-120] 91 (02/28 0600) Resp:  [4-40] 31 (02/28 0600) BP: (75-148)/(20-75) 86/37 mmHg (02/28 0630) SpO2:  [85 %-100 %] 100 % (02/28 0600) FiO2 (%):  [40 %-100 %] 40 % (02/28 0600) Weight:  [154 lb 4.8 oz (69.99 kg)-161 lb (73.029 kg)] 161 lb (73.029 kg) (02/28 0424) Last BM Date: 11/28/2014  Intake/Output from previous day: 02/27 0701 - 02/28 0700 In: 3941 [I.V.:2616; IV Piggyback:1325] Out: 1646 [Urine:524; Emesis/NG output:550; Drains:572] Intake/Output this shift:    General: Sedated on vent not responsive Abdomen: Firm and Mildly distended. 2 drains in place with purulent material, dark tail on the left but no succus or bile.  Lab Results:   Recent Labs  11/27/2014 2155 12/05/14 0426  WBC 18.5* 22.4*  HGB 8.4* 7.4*  HCT 25.0* 21.9*  PLT 278 39*   BMET  Recent Labs  11/28/2014 2155 12/05/14 0426  NA 133* 128*  K 3.7 3.7  CL 104 97  CO2 21 20  GLUCOSE 75 150*  BUN 38* 45*  CREATININE 1.16* 1.30*  CALCIUM 6.2* 6.1*     Studies/Results: Ct Abdomen Wo Contrast  12/04/2014   CLINICAL DATA:  Abscess  EXAM: CT ABDOMEN WITHOUT CONTRAST  TECHNIQUE: Multidetector CT imaging of the abdomen was performed following the standard protocol without IV contrast.  COMPARISON:  Yesterday  FINDINGS: The patient is in the prone position.  The abscess containing gas and fluid in the mesenteric on image 47 is stable.  The large mass filling the pelvis on image 66 is stable. Fluid in the right pericolic gutter  is stable.  There are dilated small bowel loops filled with contrast compatible with an element of obstruction or ileus.  Exam is otherwise stable.  IMPRESSION: Multiple abscess sees and pelvic mass are stable.  Ileus versus small bowel obstruction pattern.   Electronically Signed   By: Marybelle Killings M.D.   On: 12/04/2014 13:40   Ct Angio Chest Pe W/cm &/or Wo Cm  12/04/2014   CLINICAL DATA:  Elevated D-dimer level.  Shallow breathing.  EXAM: CT ANGIOGRAPHY CHEST WITH CONTRAST  TECHNIQUE: Multidetector CT imaging of the chest was performed using the standard protocol during bolus administration of intravenous contrast. Multiplanar CT image reconstructions and MIPs were obtained to evaluate the vascular anatomy.  CONTRAST:  That information is not available and could not be obtained. Attempts to contact CT technologist who performed exam were unsuccessful.  COMPARISON:  Chest radiograph of December 03, 2014.  FINDINGS: No pneumothorax is noted. Moderate bilateral pleural effusions are noted with adjacent subsegmental atelectasis, right greater than left. There is no evidence of pulmonary embolus. There is no evidence of thoracic aortic dissection or aneurysm. No mediastinal mass or adenopathy is noted. Moderate anasarca is noted. Moderate to severe dilatation of the esophagus is noted concerning for gastroesophageal junction stenosis. No significant osseous abnormality is noted. Ascites is noted in the visualized portion of the upper abdomen.  Review of the MIP images confirms the above findings.  IMPRESSION: No evidence of pulmonary embolus.  Moderate bilateral pleural effusions are noted with adjacent subsegmental atelectasis.  Moderate anasarca is noted.  Moderate to severe dilatation of the esophagus is noted concerning for distal esophageal narrowing or stricture.  Mild ascites is noted in the visualized portion the upper abdomen.   Electronically Signed   By: Marijo Conception, M.D.   On: 12/04/2014 14:10    Ct Abdomen Pelvis W Contrast  11/11/2014   CLINICAL DATA:  Abdominal mass. Abdominal distention. 50 lb weight loss.  EXAM: CT ABDOMEN AND PELVIS WITH CONTRAST  TECHNIQUE: Multidetector CT imaging of the abdomen and pelvis was performed using the standard protocol following bolus administration of intravenous contrast.  CONTRAST:  14mL OMNIPAQUE IOHEXOL 300 MG/ML  SOLN  COMPARISON:  Noncontrast CT on 11/28/2014  FINDINGS: Lower Chest: Small right pleural effusion and right lower lobe atelectasis noted.  Hepatobiliary: 2 tiny less than 1 cm low-attenuation lesions are seen in the right and left hepatic lobes which are too small to characterize. No other liver lesions are identified. Gallbladder is unremarkable.  Pancreas: No mass, inflammatory changes, or other significant abnormality identified.  Spleen:  Within normal limits in size and appearance.  Adrenals:  No masses identified.  Kidneys/Urinary Tract:  No evidence of masses or hydronephrosis.  Stomach/Bowel/Peritoneum: Mild ascites and diffuse body wall edema are noted. Mild peritoneal enhancement seen surrounding the liver and in the lower abdomen suspicious for peritoneal carcinomatosis. Mild dilatation of small bowel loops and colon is seen, with transition point near the sigmoid colon.  A large heterogeneously enhancing mass is seen within the right lower quadrant of the abdomen and central pelvis which measures approximately 11.0 x 15.6 cm. This contains some internal gas and appears to be centered on the sigmoid colon. This could also arise from the uterus if patient has not undergone previous hysterectomy, or potentially the ovaries or mesentery.  An extraluminal collection containing both fluid and gas is seen within the small bowel mesentery to the left of midline which measures approximately 8.0 x 8.8 cm on image 55/series 2. This is consistent with abscess secondary to bowel perforation.  Vascular/Lymphatic: Retroperitoneal lymphadenopathy seen  in aorto-caval space measuring 2.4 cm on image 48/series 2. No other definite lymphadenopathy identified.  Reproductive: Not well visualized due to large pelvic mass described above.  Other:  None.  Musculoskeletal:  No suspicious bone lesions identified.  IMPRESSION: Large approximately 16 cm mass in the lower abdomen and pelvis causing distal colonic obstruction. This is suspicious for primary colon carcinoma. Differential diagnosis also includes uterine carcinoma, ovarian carcinoma, and GI stromal tumor.  9 cm extraluminal gas and fluid collection in small bowel mesentery, consistent with localized abscess secondary to bowel perforation. No free intraperitoneal air identified.  Moderate ascites and peritoneal enhancement, suspicious for diffuse peritoneal carcinomatosis.  Mild retroperitoneal lymphadenopathy in the aortocaval space.  Two small sub-cm indeterminate liver lesions. Early liver metastases cannot definitely be excluded.  Small right pleural effusion and diffuse body wall edema.  Critical Value/emergent results were called by telephone at the time of interpretation on 11/15/2014 at 2:55 pm to the patient's floor nurse Gwenn, who verbally acknowledged these results.   Electronically Signed   By: Earle Gell M.D.   On: 12/02/2014 14:48   Dg Chest Port 1 View  12/04/2014   CLINICAL DATA:  Intubated.  EXAM: PORTABLE CHEST - 1 VIEW  COMPARISON:  12/01/2014  FINDINGS: Endotracheal tube is 2 cm above the carina. Left subclavian central line tip is in the upper  right atrium, stable. Worsening aeration in the lungs with increasing bibasilar opacities, likely atelectasis. Decreasing lung volumes. Small right pleural effusion.  IMPRESSION: Endotracheal tube tip 2 cm above the carina.  Increasing bibasilar atelectasis.  Small right effusion.   Electronically Signed   By: Rolm Baptise M.D.   On: 12/04/2014 15:16   Dg Chest Port 1 View  11/17/2014   CLINICAL DATA:  Central line placement.  EXAM: PORTABLE CHEST  - 1 VIEW  COMPARISON:  11/19/2014  FINDINGS: Left Central line is in place with the tip in the upper right atrium. No pneumothorax. Low lung volumes with bibasilar atelectasis. Heart size is borderline, accentuated by the low volumes.  IMPRESSION: Low lung volumes, bibasilar atelectasis.  Left subclavian central line tip in the upper right atrium. No pneumothorax.   Electronically Signed   By: Rolm Baptise M.D.   On: 11/17/2014 21:47   Dg Chest Port 1 View  11/23/2014   CLINICAL DATA:  Sepsis, tachycardia  EXAM: PORTABLE CHEST - 1 VIEW  COMPARISON:  11/24/2014  FINDINGS: Low lung volumes.  Right basilar opacity, likely atelectasis. No pleural effusion or pneumothorax.  Cardiomegaly.  IMPRESSION: Low lung volumes with right basilar opacity, likely atelectasis.   Electronically Signed   By: Julian Hy M.D.   On: 12/01/2014 19:45   Dg Abd Portable 1v  11/28/2014   CLINICAL DATA:  Tachycardia, sepsis. Diffuse abdominal pain, left lower quadrant pain.  EXAM: PORTABLE ABDOMEN - 1 VIEW  COMPARISON:  CT 11/13/2014  FINDINGS: Mildly dilated small bowel loops are again noted in the abdomen and pelvis. Oral contrast material noted within the stomach can small bowel loops from prior oral contrast for CT. No free air. No organomegaly.  IMPRESSION: No significant change in bowel gas pattern since earlier CT. Dilated small bowel loops again noted. No free air.   Electronically Signed   By: Rolm Baptise M.D.   On: 12/02/2014 19:53    Anti-infectives: Anti-infectives    Start     Dose/Rate Route Frequency Ordered Stop   12/04/2014 2230  vancomycin (VANCOCIN) IVPB 1000 mg/200 mL premix     1,000 mg 200 mL/hr over 60 Minutes Intravenous Daily at bedtime 12/04/2014 2145     12/02/2014 2200  piperacillin-tazobactam (ZOSYN) IVPB 3.375 g  Status:  Discontinued     3.375 g 100 mL/hr over 30 Minutes Intravenous  Once 11/12/2014 2145 11/28/2014 2149   11/16/2014 2200  piperacillin-tazobactam (ZOSYN) IVPB 3.375 g     3.375  g 12.5 mL/hr over 240 Minutes Intravenous 3 times per day 11/19/2014 2146     11/19/2014 1915  piperacillin-tazobactam (ZOSYN) IVPB 3.375 g  Status:  Discontinued     3.375 g 100 mL/hr over 30 Minutes Intravenous  Once 11/09/2014 1913 11/12/2014 1918   12/05/2014 1800  ertapenem (INVANZ) 1 g in sodium chloride 0.9 % 50 mL IVPB  Status:  Discontinued     1 g 100 mL/hr over 30 Minutes Intravenous Every 24 hours 11/28/2014 1542 11/27/2014 2145   11/28/2014 0400  piperacillin-tazobactam (ZOSYN) IVPB 3.375 g  Status:  Discontinued     3.375 g 12.5 mL/hr over 240 Minutes Intravenous Every 8 hours 11/13/2014 2337 11/27/2014 1542   11/28/2014 2000  piperacillin-tazobactam (ZOSYN) IVPB 3.375 g     3.375 g 12.5 mL/hr over 240 Minutes Intravenous  Once 11/22/2014 1952 12/01/2014 2245      Assessment/Plan: 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 Abdominal abscesses, successfully  drained percutaneously. No apparent GI fistula. Patient intubated at time of procedure and remains intubated. Some worsening signs of sepsis today with elevated white count and some hypotension. May be temporarily related to drain placement and manipulation. Areas appear to be successfully drained and hopefully will provide source control. Overall prognosis guarded    LOS: 3 days    Brennen Gardiner T 12/05/2014

## 2014-12-05 NOTE — Progress Notes (Signed)
PULMONARY / CRITICAL CARE MEDICINE HISTORY AND PHYSICAL EXAMINATION   Name: Nicole Cannon MRN: [id number] DOB: [birth year]    ADMISSION DATE:  11/11/2014  PRIMARY SERVICE: PCCM  CHIEF COMPLAINT:  Weakness, abdominal distension  BRIEF PATIENT DESCRIPTION: 69 year old woman with an abdominal mass, malnutrition, and septic shock.  SIGNIFICANT STUDIES:  2/27 CT Ab> Large 16cm mass in lower abdomen and pelvis causing distal obstruction, suspicious for colon carcinoma, consider uterin or ovarian carcinoma, 9cm extraluminal gass in small bowel mesentery, consistent with localise abscess, no peritoneal free air, small liver lesions.  Mild retroperitoneal lymphadenopathy, small right pleural effusion 2/27 TTE> LVEF 60%, grade 1 DD, mitral valve annulus calcified, small pericardial effusion  SIGNIFICANT EVENTS : 2/27 IR guided abscess drainage, required intubation  SUBJECTIVE:  Intubated yesterday for IR guided drainage, failed spontaneous breathing trial this morning.  VITAL SIGNS: Temp:  [97.5 F (36.4 C)-98.2 F (36.8 C)] 97.8 F (36.6 C) (02/28 0800) Pulse Rate:  [29-120] 29 (02/28 0800) Resp:  [4-40] 29 (02/28 0800) BP: (75-148)/(20-75) 106/42 mmHg (02/28 0800) SpO2:  [85 %-100 %] 99 % (02/28 0800) FiO2 (%):  [40 %-100 %] 40 % (02/28 0819) Weight:  [69.99 kg (154 lb 4.8 oz)-73.029 kg (161 lb)] 73.029 kg (161 lb) (02/28 0424) HEMODYNAMICS: CVP:  [7 mmHg-32 mmHg] 10 mmHg VENTILATOR SETTINGS: Vent Mode:  [-] CPAP FiO2 (%):  [40 %-100 %] 40 % Set Rate:  [14 bmp] 14 bmp Vt Set:  [400 mL] 400 mL PEEP:  [5 cmH20] 5 cmH20 Pressure Support:  [12 cmH20] 12 cmH20 Plateau Pressure:  [19 cmH20-26 cmH20] 19 cmH20 INTAKE / OUTPUT: Intake/Output      02/27 0701 - 02/28 0700 02/28 0701 - 02/29 0700   I.V. (mL/kg) 2727.8 (37.4) 214.9 (2.9)   Other  30   IV Piggyback 1337.5 12.5   Total Intake(mL/kg) 4065.3 (55.7) 257.4 (3.5)   Urine (mL/kg/hr) 524 (0.3) 75 (0.5)   Emesis/NG output 550 (0.3)     Drains 572 (0.3)    Stool     Total Output 1646 75   Net +2419.3 +182.4          PHYSICAL EXAMINATION: General:  Sedated on vent HEENT: NCAT, ETT PULM: CTA B CV: RRR, no mgr Ab: mildly distended, tender to touch Ext: warm, massive edema throughout Neuro: A&Ox4, maew  LABS:  CBC  Recent Labs Lab 11/29/2014 0555 11/24/2014 2155 12/05/14 0426  WBC 31.5* 18.5* 22.4*  HGB 8.3* 8.4* 7.4*  HCT 25.1* 25.0* 21.9*  PLT 388 278 39*   Coag's  Recent Labs Lab 11/20/2014 0555 11/13/2014 2155  APTT 36 36  INR 1.25 1.34   BMET  Recent Labs Lab 11/30/2014 0555 11/22/2014 2155 12/05/14 0426  NA 136 133* 128*  K 3.3* 3.7 3.7  CL 97 104 97  CO2 23 21 20   BUN 37* 38* 45*  CREATININE 1.20* 1.16* 1.30*  GLUCOSE 87 75 150*   Electrolytes  Recent Labs Lab 12/05/2014 0555 11/22/2014 2155 12/05/14 0426  CALCIUM 6.9* 6.2* 6.1*  MG 1.7  --   --    Sepsis Markers  Recent Labs Lab 12/05/2014 1615 11/08/2014 2141 11/28/2014 2155 12/04/14 1830  LATICACIDVEN 2.85* 2.3*  --  2.1*  PROCALCITON  --   --  23.62  --    ABG  Recent Labs Lab 12/04/14 1504  PHART 7.341*  PCO2ART 36.6  PO2ART 299.0*   Liver Enzymes  Recent Labs Lab 11/29/2014 1601 12/02/2014 0555 11/12/2014 2155  AST 21 19  22  ALT 14 12 12   ALKPHOS 87 100 80  BILITOT 1.4* 1.4* 1.3*  ALBUMIN 1.8* 1.5* 1.5*   Cardiac Enzymes  Recent Labs Lab 11/14/2014 0555 12/04/2014 2155  TROPONINI <0.03 <0.03   Glucose  Recent Labs Lab 12/05/14 0751  GLUCAP 142*    Imaging   EKG: Sinus Tachycardia, inferior q-waves suggestive of prior infarction. 2/28 CXR: RLL atelectasis, ETT approaching right mainstem  ASSESSMENT / PLAN:  Principal Problem:   Severe sepsis with septic shock Active Problems:   Anasarca   Pelvic mass in female   Hypokalemia   Renal insufficiency   Protein-calorie malnutrition, severe   Bowel perforation   Abscess of gastrointestinal tract   Abdominal mass   Septic shock   Acute  respiratory failure with hypoxia   PULMONARY A: Acute Hypoxemic Respiratory failure> no clear infiltrate, failed SBT 2/28, weakness? Given worsening shock will hold off on extubation P:   Full vent support Daily CXR Daily SBT/WUA Adjust ETT 2/28  CARDIOVASCULAR A: Septic shock, adequately resuscitated, 2/28 CVP 10 Abnormal EKG, old Q wave but Echo OK LINES / TUBES: Left [place] CVC - 11/25/2014 >  P:   Repeat 12 lead CVP goal 8-12 Levophed for MAP > 65   RENAL A: AKI, possible CKD Hypokalemia Hypocalcemia> corrected calcium still low P:   KVO IVF Monitor BMET and UOP Replace electrolytes as needed   GASTROINTESTINAL A: Severe protein calorie malnutrition Abdominal mass Abdominal abscess > s/p drain x2 with purulent drainage P:   Per Surgery Monitor drain output Discussed nutrition with surgery, no clear bowel perforation so OK for trickle rate enteric feedings today  HEMATOLOGIC A: Possible colon or GYN malignancy Anemia Thrombocytopenia > repeat value consistent, unclear etiology, consider DIC P:   Will  need colonoscopy after intra-abdominal infections improved Hold heparin Check DIC panel SCD  INFECTIOUS A: Septic Shock, due to intra-abdominal abscess  CULTURES: Blood - 2/26 - pending Wound 2/27 - multiple organisms  P:   ANTIBIOTICS: Pip-tazo: 2/25- Vanc: 2/26-  F/u cultures  ENDOCRINE A: No active issues P:     NEUROLOGIC A: No active issues:    BEST PRACTICE / DISPOSITION Level of Care:  ICU Primary Service:  PCCM Consultants:  SGY Code Status:  Full Diet:  Start trickle feedings DVT Px:  D/c GI Px:  Famotidine Skin Integrity:  Intact Social / Family:  Updated on admission.  TODAY'S SUMMARY: 69 y/o woman with abdominal distention and septic shock due to abdominal abscesses, failed SBT 2/28, has worsening shock 2/28 presumably all related to procedure yesterday.  Continue full vent support, try trickle enteric feedings  2/28.  My cc time 45 minutes   Roselie Awkward, MD [place] [facility] Pager: [phone] Cell: [phone] If no response, call [phone]    12/05/2014, 9:17 AM

## 2014-12-05 NOTE — Progress Notes (Signed)
CRITICAL VALUE ALERT  Critical value received: Body fluid culture results  Date of notification:  12/05/2014  Time of notification:  0900  Critical value read back: yes  Nurse who received alert:  Lacinda Axon RN  MD notified (1st page):  Dr. [place] Bells  Time of first page:  0900  MD notified (2nd page): n/a  Time of second page: n/a  Responding MD: Dr. [place] Bells  Time MD responded:  [phone]

## 2014-12-05 NOTE — Progress Notes (Signed)
Changed vent mode to PRVC due to increased agitation and HR to 120 BPM.

## 2014-12-05 NOTE — Progress Notes (Signed)
CRITICAL VALUE ALERT  Critical value received: Platelets 28  Date of notification: 12/05/2014  Time of notification:  11:00  Critical value read back: yes  Nurse who received alert:  Lacinda Axon RN  MD notified (1st page): Dr. [place] Bells  Time of first page:  11:03, on unit, notified of results  MD notified (2nd page): n/a  Time of second page: n/a  Responding MD:  Dr. [place] Bells  Time MD responded: 11:03

## 2014-12-05 NOTE — Progress Notes (Signed)
NUTRITION FOLLOW-UP/CONSULT  INTERVENTION: Initiate Vital HP @ 15 ml/hr via OGT and increase by 10 ml every 8 hours to goal rate of 55 ml/hr.     Tube feeding regimen provides 1320 kcal (93% of needs), 116 grams of protein, and 1104 ml of H2O.   Due to risk of refeeding, recommend conservative advancement of Vital HP. Nicole Cannon is severely malnourished with poor PO intake PTA.  RD to continue to monitor.   NUTRITION DIAGNOSIS: Inadequate oral intake related to poor appetite as evidenced by abdominal pain for 2-3 months.   Goal: Nicole Cannon to meet >/= 90% of estimated needs  Monitor:  PO intake when diet advanced, weight trends, labs  ASSESSMENT: Nicole Cannon presents with generalized weakness and swelling in Nicole Cannon legs for the past 2 weeks.  Noted to be hypokalemic and have mild renal insufficiency.  CT scan showed pelvic mass ? Pelvic carcinomatosis?   Nicole Cannon hx of IBS.    Patient was intubated on ventilator support d/t drainage procedure but Nicole Cannon remains intubated. MV: 10.5 L/min Temp (24hrs), Avg:97.9 F (36.6 C), Min:97.5 F (36.4 C), Max:98.2 F (36.8 C)  Propofol: none  Labs reviewed: Low Na Elevated BUN & Creatinine  Height: Ht Readings from Last 1 Encounters:  12/04/2014 5\' 2"  (1.575 m)    Weight: Wt Readings from Last 1 Encounters:  12/05/14 161 lb (73.029 kg)   BMI:  Body mass index is 29.44 kg/(m^2).  Estimated Nutritional Needs: Kcal: 1422 Protein: 105-115 g  Fluid: >/= 1.5 L/day  Skin: WDL  Diet Order: Diet NPO time specified  EDUCATION NEEDS: -No education needs identified at this time   Intake/Output Summary (Last 24 hours) at 12/05/14 0920 Last data filed at 12/05/14 0800  Gross per 24 hour  Intake 4322.71 ml  Output   1721 ml  Net 2601.71 ml    Last BM: 2/26    Labs:   Recent Labs Lab 11/12/2014 0555 11/11/2014 2155 12/05/14 0426  NA 136 133* 128*  K 3.3* 3.7 3.7  CL 97 104 97  CO2 23 21 20   BUN 37* 38* 45*  CREATININE 1.20* 1.16* 1.30*  CALCIUM 6.9*  6.2* 6.1*  MG 1.7  --   --   GLUCOSE 87 75 150*    CBG (last 3)   Recent Labs  12/05/14 0751  GLUCAP 142*    Scheduled Meds: . antiseptic oral rinse  7 mL Mouth Rinse QID  . calcium gluconate  1 g Intravenous Once  . chlorhexidine  15 mL Mouth Rinse BID  . famotidine (PEPCID) IV  20 mg Intravenous Q24H  . feeding supplement (VITAL HIGH PROTEIN)  1,000 mL Per Tube Q24H  . insulin aspart  0-9 Units Subcutaneous 6 times per day  . piperacillin-tazobactam  3.375 g Intravenous 3 times per day  . sodium chloride  3 mL Intravenous Q12H  . vancomycin  1,000 mg Intravenous QHS    Continuous Infusions: . sodium chloride 100 mL/hr at 12/04/14 1755  . norepinephrine (LEVOPHED) Adult infusion 14 mcg/min (12/05/14 0800)  . propofol 25 mcg/kg/min (12/05/14 0800)    Clayton Bibles, MS, RD, LDN Pager: [phone] After Hours Pager: [phone]

## 2014-12-06 ENCOUNTER — Inpatient Hospital Stay (HOSPITAL_COMMUNITY): Payer: Commercial Managed Care - HMO

## 2014-12-06 LAB — CBC WITH DIFFERENTIAL/PLATELET
BASOS ABS: 0 10*3/uL (ref 0.0–0.1)
Basophils Relative: 0 % (ref 0–1)
Eosinophils Absolute: 0 10*3/uL (ref 0.0–0.7)
Eosinophils Relative: 0 % (ref 0–5)
HEMATOCRIT: 20.2 % — AB (ref 36.0–46.0)
Hemoglobin: 6.7 g/dL — CL (ref 12.0–15.0)
LYMPHS ABS: 1.1 10*3/uL (ref 0.7–4.0)
Lymphocytes Relative: 5 % — ABNORMAL LOW (ref 12–46)
MCH: 26.4 pg (ref 26.0–34.0)
MCHC: 33.2 g/dL (ref 30.0–36.0)
MCV: 79.5 fL (ref 78.0–100.0)
Monocytes Absolute: 0.2 10*3/uL (ref 0.1–1.0)
Monocytes Relative: 1 % — ABNORMAL LOW (ref 3–12)
Neutro Abs: 21.2 10*3/uL — ABNORMAL HIGH (ref 1.7–7.7)
Neutrophils Relative %: 94 % — ABNORMAL HIGH (ref 43–77)
PLATELETS: 12 10*3/uL — AB (ref 150–400)
RBC: 2.54 MIL/uL — ABNORMAL LOW (ref 3.87–5.11)
RDW: 19 % — AB (ref 11.5–15.5)
WBC: 22.5 10*3/uL — ABNORMAL HIGH (ref 4.0–10.5)

## 2014-12-06 LAB — GLUCOSE, CAPILLARY
GLUCOSE-CAPILLARY: 112 mg/dL — AB (ref 70–99)
Glucose-Capillary: 114 mg/dL — ABNORMAL HIGH (ref 70–99)
Glucose-Capillary: 123 mg/dL — ABNORMAL HIGH (ref 70–99)
Glucose-Capillary: 111 mg/dL — ABNORMAL HIGH (ref 70–99)
Glucose-Capillary: 107 mg/dL — ABNORMAL HIGH (ref 70–99)

## 2014-12-06 LAB — BASIC METABOLIC PANEL
Anion gap: 7 (ref 5–15)
BUN: 44 mg/dL — AB (ref 6–23)
CALCIUM: 6.5 mg/dL — AB (ref 8.4–10.5)
CO2: 21 mmol/L (ref 19–32)
CREATININE: 1.08 mg/dL (ref 0.50–1.10)
Chloride: 104 mmol/L (ref 96–112)
GFR calc Af Amer: 59 mL/min — ABNORMAL LOW (ref >90)
GFR, EST NON AFRICAN AMERICAN: 51 mL/min — AB (ref >90)
GLUCOSE: 121 mg/dL — AB (ref 70–99)
POTASSIUM: 3.8 mmol/L (ref 3.5–5.1)
Sodium: 132 mmol/L — ABNORMAL LOW (ref 135–145)

## 2014-12-06 LAB — CBC
HCT: 25.4 % — ABNORMAL LOW (ref 36.0–46.0)
Hemoglobin: 7.8 g/dL — ABNORMAL LOW (ref 12.0–15.0)
MCH: 23.4 pg — AB (ref 26.0–34.0)
MCHC: 30.7 g/dL (ref 30.0–36.0)
MCV: 76.3 fL — ABNORMAL LOW (ref 78.0–100.0)
PLATELETS: 98 10*3/uL — AB (ref 150–400)
RBC: 3.33 MIL/uL — ABNORMAL LOW (ref 3.87–5.11)
RDW: 21.3 % — ABNORMAL HIGH (ref 11.5–15.5)
WBC: 6 10*3/uL (ref 4.0–10.5)

## 2014-12-06 LAB — HEPATIC FUNCTION PANEL
ALK PHOS: 140 U/L — AB (ref 39–117)
ALT: 25 U/L (ref 0–35)
AST: 31 U/L (ref 0–37)
Albumin: <1 g/dL — ABNORMAL LOW (ref 3.5–5.2)
BILIRUBIN DIRECT: 1.9 mg/dL — AB (ref 0.0–0.5)
BILIRUBIN TOTAL: 2.6 mg/dL — AB (ref 0.3–1.2)
Indirect Bilirubin: 0.7 mg/dL (ref 0.3–0.9)
Total Protein: 3.9 g/dL — ABNORMAL LOW (ref 6.0–8.3)

## 2014-12-06 LAB — FOLATE RBC
FOLATE, HEMOLYSATE: 304.1 ng/mL
FOLATE, RBC: 1174 ng/mL (ref >498)
Hematocrit: 25.9 % — ABNORMAL LOW (ref 34.0–46.6)

## 2014-12-06 LAB — VANCOMYCIN, TROUGH: Vancomycin Tr: 18.9 ug/mL (ref 10.0–20.0)

## 2014-12-06 LAB — PREPARE RBC (CROSSMATCH)

## 2014-12-06 LAB — PREALBUMIN: Prealbumin: <3 mg/dL — ABNORMAL LOW (ref 17.0–34.0)

## 2014-12-06 MED ORDER — PANTOPRAZOLE SODIUM 40 MG IV SOLR
40.0000 mg | Freq: Every day | INTRAVENOUS | Status: DC
  Administered 2014-12-06 – 2014-12-10 (×5): 40 mg via INTRAVENOUS
  Filled 2014-12-06 (×5): qty 40

## 2014-12-06 MED ORDER — SODIUM CHLORIDE 0.9 % IV SOLN
Freq: Once | INTRAVENOUS | Status: DC
Start: 2014-12-06 — End: 2014-12-12

## 2014-12-06 NOTE — Progress Notes (Addendum)
ANTIBIOTIC CONSULT NOTE  Pharmacy Consult for Zosyn/Vancomycin Indication: Intra-abdominal infection/abscess/Sepsis  No Known Allergies  Patient Measurements: Height: 5\' 2"  (157.5 cm) Weight: 165 lb 12.6 oz (75.2 kg) IBW/kg (Calculated) : 50.1 Wt=  Vital Signs: Temp: 97.6 F (36.4 C) (02/29 0731) Temp Source: Oral (02/29 0731) BP: 98/45 mmHg (02/29 0900) Pulse Rate: 95 (02/29 0900) Intake/Output from previous day: 02/28 0701 - 02/29 0700 In: 4517.5 [I.V.:3515; NG/GT:270; IV Piggyback:532.5] Out: 1330 [Urine:1080; Drains:250] Intake/Output from this shift: Total I/O In: 203.8 [I.V.:183.8; NG/GT:20] Out: 100 [Urine:100]  Labs:  Recent Labs  11/19/2014 2155 12/05/14 0426 12/05/14 1007 12/06/14 0600  WBC 18.5* 22.4*  --  22.5*  HGB 8.4* 7.4*  --  6.7*  PLT 278 39* 28* 12*  CREATININE 1.16* 1.30*  --  1.08   Estimated Creatinine Clearance: 46 mL/min (by C-G formula based on Cr of 1.08). No results for input(s): VANCOTROUGH, VANCOPEAK, VANCORANDOM, GENTTROUGH, GENTPEAK, GENTRANDOM, TOBRATROUGH, TOBRAPEAK, TOBRARND, AMIKACINPEAK, AMIKACINTROU, AMIKACIN in the last 72 hours.   Microbiology: Recent Results (from the past 720 hour(s))  Surgical pcr screen     Status: None   Collection Time: 11/21/2014  5:18 PM  Result Value Ref Range Status   MRSA, PCR NEGATIVE NEGATIVE Final   Staphylococcus aureus NEGATIVE NEGATIVE Final    Comment:        The Xpert SA Assay (FDA approved for NASAL specimens in patients over 107 years of age), is one component of a comprehensive surveillance program.  Test performance has been validated by [facility] for patients greater than or equal to 71 year old. It is not intended to diagnose infection nor to guide or monitor treatment.   MRSA PCR Screening     Status: None   Collection Time: 11/16/2014  8:39 PM  Result Value Ref Range Status   MRSA by PCR NEGATIVE NEGATIVE Final    Comment:        The GeneXpert MRSA Assay (FDA approved  for NASAL specimens only), is one component of a comprehensive MRSA colonization surveillance program. It is not intended to diagnose MRSA infection nor to guide or monitor treatment for MRSA infections.   Culture, blood (x 2)     Status: None (Preliminary result)   Collection Time: 11/21/2014  9:55 PM  Result Value Ref Range Status   Specimen Description BLOOD LEFT CENTRAL LINE  Final   Special Requests   Final    BOTTLES DRAWN AEROBIC AND ANAEROBIC 10CC BOTH BOTTLES   Culture   Final           BLOOD CULTURE RECEIVED NO GROWTH TO DATE CULTURE WILL BE HELD FOR 5 DAYS BEFORE ISSUING A FINAL NEGATIVE REPORT Performed at [facility]    Report Status PENDING  Incomplete  Culture, routine-abscess     Status: None (Preliminary result)   Collection Time: 12/04/14  4:50 PM  Result Value Ref Range Status   Specimen Description PERITONEAL CAVITY  Final   Special Requests NONE  Final   Gram Stain   Final    ABUNDANT WBC PRESENT, PREDOMINANTLY PMN ABUNDANT GRAM NEGATIVE RODS MODERATE GRAM POSITIVE RODS FEW GRAM POSITIVE COCCI IN PAIRS    Culture   Final    MULTIPLE ORGANISMS PRESENT, NONE PREDOMINANT Performed at [facility]    Report Status PENDING  Incomplete  Body fluid culture     Status: None (Preliminary result)   Collection Time: 12/04/14  4:51 PM  Result Value Ref Range Status   Specimen  Description PERITONEAL CAVITY  Final   Special Requests NONE  Final   Gram Stain   Final    ABUNDANT WBC PRESENT, PREDOMINANTLY PMN ABUNDANT GRAM NEGATIVE RODS MODERATE GRAM POSITIVE RODS FEW GRAM POSITIVE COCCI IN PAIRS Gram Stain Report Called to,Read Back By and Verified With: Gram Stain Report Called to,Read Back By and Verified With: AMY ARNOLD RN 12/05/14 AT 73 AM BY [facility]    Culture NO GROWTH Performed at [facility]   Final   Report Status PENDING  Incomplete    Medical History: Past Medical History  Diagnosis Date  . Irritable bowel       Assessment: 69 yoF admitted 2/25 with c/o generalized weakness while at work. Zosyn per Rx for possible intra-abdominal infection/abcess.  CT scan also demonstrates possible pelvic mass or pelvic carcinomatosis - admitted for workup. Pt decompensated on 2/26 and was transferred to the ICU. Pharmacy is asked to add vancomycin.  2/27: IR drainage of pelvic L and R abscesses 2/29 remains intubated; feculent drainage from L pelvic drain; purulent drainage from R side  2/25 >> zosyn  >> 2/26 >> vancomycin >>  Tmax: afeb since admission WBCs: now stable at 22 Renal: SCr improved to wnl; CrCl 46 Lactic acid: 2.85 (2/25)  Microbiology: 2/26 MRSA PCR: negative 2/26 blood x2: NGTD (collected after Zosyn start) 2/27 peritoneal cavity abscess: abundant GNR, some GPRs and GPCs. None predominant 2/27 peritoneal fluid: NGTD; abundant GNR, some GPR on stain   Goal of Therapy:   Vancomycin trough level 15-20 mcg/ml   Eradication of infection  Appropriate antibiotic dosing for indication and renal function   Plan:   Continue Zosyn 3.375 Gm IV q8h   Continue Vancomycin 1gm IV q24h.  Check trough tonight as day 4 of therapy and may continue with minimal improvement seen so far  Qualifies for 750 mg q12 dosing with improved renal function, but will hold off until trough results tonight.  Note vancomycin > Zosyn have been linked to thrombocytopenia; would consider alternative regimen if platelets fail to recover soon  F/u SCr/cultures/levels as needed, LOT   Reuel Boom, PharmD Pager: [phone] 12/06/2014, 10:31 AM

## 2014-12-06 NOTE — Progress Notes (Signed)
NUTRITION FOLLOW-UP  INTERVENTION: - Continue Vital HP trickle feeds @ 10 ml/hr via OGT  - TF advancement per Surgery - Once medically able, recommend increasing TF by 10 ml every 8 hours to goal rate of 55 ml/hr.     Tube feeding regimen provides 1320 kcal (93% of needs), 116 grams of protein, and 1104 ml of H2O.   Due to risk of refeeding, recommend conservative advancement of Vital HP. Pt is severely malnourished with poor PO intake PTA.  RD to continue to monitor.  NUTRITION DIAGNOSIS: Inadequate oral intake related to poor appetite as evidenced by abdominal pain for 2-3 months; ongoing  Goal: Pt to meet >/= 90% of estimated needs; not met  Monitor:  PO intake when diet advanced, weight trends, labs  ASSESSMENT: Pt presents with generalized weakness and swelling in her legs for the past 2 weeks.  Noted to be hypokalemic and have mild renal insufficiency.  CT scan showed pelvic mass ? Pelvic carcinomatosis?   Pt hx of IBS.    Patient is currently intubated on ventilator support MV: 8.2 L/min Temp (24hrs), Avg:98.2 F (36.8 C), Min:97.6 F (36.4 C), Max:99.1 F (37.3 C)  Propofol: none  Per RN, TF ordered to run at 10 mL/hr trickle feeds per MD. Doesn't hear bowel sounds. Tolerating trickle feeds.   Trickle feeds to provide 240 kcal and 21 g protein.  Pt meets criteria for severe MALNUTRITION in the context of chronic illness as evidenced by less than >75% of PO intake in the past month, moderate to severe fat and muscle wasting and severe fluid accumulation.   Height: Ht Readings from Last 1 Encounters:  11/15/2014 $RemoveB'5\' 2"'nfmUBOiI$  (1.575 m)    Weight: Wt Readings from Last 1 Encounters:  12/06/14 165 lb 12.6 oz (75.2 kg)   BMI:  Body mass index is 30.31 kg/(m^2).  Estimated Nutritional Needs: Kcal: 1479 Protein: 105-115 g  Fluid: >/= 1.5 L/day  Skin: WDL  Diet Order: Diet NPO time specified  EDUCATION NEEDS: -No education needs identified at this  time   Intake/Output Summary (Last 24 hours) at 12/06/14 1115 Last data filed at 12/06/14 1000  Gross per 24 hour  Intake   3954 ml  Output   1280 ml  Net   2674 ml    Last BM: 2/28    Labs:   Recent Labs Lab 11/08/2014 0555 11/21/2014 2155 12/05/14 0426 12/06/14 0600  NA 136 133* 128* 132*  K 3.3* 3.7 3.7 3.8  CL 97 104 97 104  CO2 $Re'23 21 20 21  'qkO$ BUN 37* 38* 45* 44*  CREATININE 1.20* 1.16* 1.30* 1.08  CALCIUM 6.9* 6.2* 6.1* 6.5*  MG 1.7  --   --   --   GLUCOSE 87 75 150* 121*    CBG (last 3)   Recent Labs  12/05/14 2341 12/06/14 0503 12/06/14 0729  GLUCAP 112* 114* 112*    Scheduled Meds: . sodium chloride   Intravenous Once  . antiseptic oral rinse  7 mL Mouth Rinse QID  . calcium gluconate  1 g Intravenous Once  . chlorhexidine  15 mL Mouth Rinse BID  . insulin aspart  0-9 Units Subcutaneous 6 times per day  . pantoprazole (PROTONIX) IV  40 mg Intravenous QHS  . piperacillin-tazobactam  3.375 g Intravenous 3 times per day  . sodium chloride  3 mL Intravenous Q12H  . vancomycin  1,000 mg Intravenous QHS    Continuous Infusions: . sodium chloride 30 mL/hr at 12/06/14 0652  .  feeding supplement (VITAL HIGH PROTEIN)    . fentaNYL infusion INTRAVENOUS 175 mcg/hr (12/06/14 1000)  . norepinephrine (LEVOPHED) Adult infusion 18 mcg/min (12/06/14 1042)    Laurette Schimke [place], [place], [place]

## 2014-12-06 NOTE — Progress Notes (Signed)
Referring Physician(s): CCS  Subjective: Intubated/sedated  Allergies: Review of patient's allergies indicates no known allergies.  Medications: See med rec.  Vital Signs: BP 111/53 mmHg  Pulse 94  Temp(Src) 97.8 F (36.6 C) (Axillary)  Resp 12  Ht 5\' 2"  (1.575 m)  Wt 165 lb 12.6 oz (75.2 kg)  BMI 30.31 kg/m2  SpO2 100%  Physical Exam General: Intubated, sedated Abd: Distended, Right abdominal drain purulent debris within serous 24 hr 200cc, Left abdominal drain feculent 24hr 50cc, Cx multi organisms  Imaging: Ct Abdomen Pelvis Wo Contrast  11/25/2014   CLINICAL DATA:  Edema in the lower extremities beginning about 3 weeks ago and progressively worsening. Weakness beginning this week worsening. Diffuse abdominal pain and nausea for 2 weeks.  EXAM: CT ABDOMEN AND PELVIS WITHOUT CONTRAST  TECHNIQUE: Multidetector CT imaging of the abdomen and pelvis was performed following the standard protocol without IV contrast.  COMPARISON:  None.  FINDINGS: Small right pleural effusion with basilar atelectasis. Small pericardial effusion. Mild cardiac enlargement.  Evaluation of solid organs and vascular structures is limited without IV contrast material. Evaluation of bowel is limited without oral contrast. Loculated appearing fluid collections are demonstrated in the right upper quadrant posterior to the liver and extending inferior to the liver along the right lateral abdominal and pelvic wall. Additional fluid collections are suggested in the mesenteric. There is an air-fluid level in the central mesentery to the left which could represent a focal dilated bowel loop or an abscess. Loculated hyperdense fluid collections versus mass lesions in the pelvis. Nodular lesions in the mesentery. No free air. Small bowel are mildly dilated with diffuse wall thickening. Bladder is decompressed with mild wall thickening. I believe that the changes with most likely represent pelvic masses with diffuse  peritoneal carcinomatosis and associated fluid collections. Abscess or inflammatory process are not excluded. Diffuse enteritis is not excluded. Repeat imaging with oral and IV contrast material would be helpful to better define the abnormalities if the patient can't tolerate contrast.  Tiny subcentimeter lesion in the right lobe of the liver is indeterminate but probably represents a cyst. Gallbladder is mildly contracted. No stones or wall thickening. No bile duct dilatation. Unenhanced appearance of the spleen, pancreas, adrenal glands, kidneys, abdominal aorta, and inferior vena cava is unremarkable. Scattered retroperitoneal and mesenteric lymph nodes are not pathologically enlarged. No free air in the abdomen.  Pelvis: See above. 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 No destructive bone lesions appreciated. Diffuse soft tissue edema.  IMPRESSION: Examination is technically limited without IV contrast material and oral contrast material. 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 Small bowel are mildly dilated with diffusely thickened wall, possibly due to tumor infiltration, reactive inflammation, or enteritis. Central abdominal abscess is not excluded. Multiple loculated fluid collections. Small right pleural effusion with basilar atelectasis. Diffuse edema.   Electronically Signed   By: Lucienne Capers M.D.   On: 11/12/2014 19:13   Ct Abdomen Wo Contrast  12/04/2014   CLINICAL DATA:  Abscess  EXAM: CT ABDOMEN WITHOUT CONTRAST  TECHNIQUE: Multidetector CT imaging of the abdomen was performed following the standard protocol without IV contrast.  COMPARISON:  Yesterday  FINDINGS: The patient is in the prone position.  The abscess containing gas and fluid in the mesenteric on image 47 is stable.  The large mass filling the pelvis on image 66 is stable. Fluid in the right pericolic gutter is stable.  There are dilated small bowel loops filled with contrast compatible with an element of obstruction or ileus.  Exam is otherwise stable.  IMPRESSION: Multiple abscess sees and pelvic mass are stable.  Ileus versus small bowel obstruction pattern.   Electronically Signed   By: Marybelle Killings M.D.   On: 12/04/2014 13:40   Dg Chest 2 View  11/19/2014   CLINICAL DATA:  Shortness of breath, leg swelling  EXAM: CHEST  2 VIEW  COMPARISON:  None.  FINDINGS: Cardiomediastinal silhouette is unremarkable. Mild degenerative changes thoracic spine. There is elevation of the right hemidiaphragm. No acute infiltrate or pulmonary edema. Mild deformity of proximal shaft of the right humerus question prior fracture.  IMPRESSION: No active disease. There is elevation of the right hemidiaphragm. Mild degenerative changes thoracic spine. Question prior fracture proximal shaft of right humerus.   Electronically Signed   By: Lahoma Crocker M.D.   On: 12/01/2014 16:18   Ct Angio Chest Pe W/cm &/or Wo Cm  12/04/2014   CLINICAL DATA:  Elevated D-dimer level.  Shallow breathing.  EXAM: CT ANGIOGRAPHY CHEST WITH CONTRAST  TECHNIQUE: Multidetector CT imaging of the chest was performed using the standard protocol during bolus administration of intravenous contrast. Multiplanar CT image reconstructions and MIPs were obtained to evaluate the vascular anatomy.  CONTRAST:  That information is not available and could not be obtained. Attempts to contact CT technologist who performed exam were unsuccessful.  COMPARISON:  Chest radiograph of December 03, 2014.  FINDINGS: No pneumothorax is noted. Moderate bilateral pleural effusions are noted with adjacent subsegmental atelectasis, right greater than left. There is no evidence of pulmonary embolus. There is no evidence of thoracic aortic dissection or aneurysm. No mediastinal mass or adenopathy is noted. Moderate anasarca is noted. Moderate to severe dilatation of the esophagus is noted concerning for  gastroesophageal junction stenosis. No significant osseous abnormality is noted. Ascites is noted in the visualized portion of the upper abdomen.  Review of the MIP images confirms the above findings.  IMPRESSION: No evidence of pulmonary embolus.  Moderate bilateral pleural effusions are noted with adjacent subsegmental atelectasis.  Moderate anasarca is noted.  Moderate to severe dilatation of the esophagus is noted concerning for distal esophageal narrowing or stricture.  Mild ascites is noted in the visualized portion the upper abdomen.   Electronically Signed   By: Marijo Conception, M.D.   On: 12/04/2014 14:10   Ct Abdomen Pelvis W Contrast  12/03/2014   CLINICAL DATA:  Abdominal mass. Abdominal distention. 50 lb weight loss.  EXAM: CT ABDOMEN AND PELVIS WITH CONTRAST  TECHNIQUE: Multidetector CT imaging of the abdomen and pelvis was performed using the standard protocol following bolus administration of intravenous contrast.  CONTRAST:  98mL OMNIPAQUE IOHEXOL 300 MG/ML  SOLN  COMPARISON:  Noncontrast CT on 11/24/2014  FINDINGS: Lower Chest: Small right pleural effusion and right lower lobe atelectasis noted.  Hepatobiliary: 2 tiny less than 1 cm low-attenuation lesions are seen in the right and left hepatic lobes which are too small to characterize. No other liver lesions are identified. Gallbladder is unremarkable.  Pancreas: No mass, inflammatory changes, or other significant abnormality identified.  Spleen:  Within normal limits in size and appearance.  Adrenals:  No masses identified.  Kidneys/Urinary Tract:  No evidence of masses or hydronephrosis.  Stomach/Bowel/Peritoneum: Mild ascites and diffuse body wall edema are noted. Mild peritoneal enhancement seen surrounding the liver and in the lower abdomen suspicious for peritoneal carcinomatosis. Mild dilatation of small bowel  loops and colon is seen, with transition point near the sigmoid colon.  A large heterogeneously enhancing mass is seen within the  right lower quadrant of the abdomen and central pelvis which measures approximately 11.0 x 15.6 cm. This contains some internal gas and appears to be centered on the sigmoid colon. This could also arise from the uterus if patient has not undergone previous hysterectomy, or potentially the ovaries or mesentery.  An extraluminal collection containing both fluid and gas is seen within the small bowel mesentery to the left of midline which measures approximately 8.0 x 8.8 cm on image 55/series 2. This is consistent with abscess secondary to bowel perforation.  Vascular/Lymphatic: Retroperitoneal lymphadenopathy seen in aorto-caval space measuring 2.4 cm on image 48/series 2. No other definite lymphadenopathy identified.  Reproductive: Not well visualized due to large pelvic mass described above.  Other:  None.  Musculoskeletal:  No suspicious bone lesions identified.  IMPRESSION: Large approximately 16 cm mass in the lower abdomen and pelvis causing distal colonic obstruction. This is suspicious for primary colon carcinoma. Differential diagnosis also includes uterine carcinoma, ovarian carcinoma, and GI stromal tumor.  9 cm extraluminal gas and fluid collection in small bowel mesentery, consistent with localized abscess secondary to bowel perforation. No free intraperitoneal air identified.  Moderate ascites and peritoneal enhancement, suspicious for diffuse peritoneal carcinomatosis.  Mild retroperitoneal lymphadenopathy in the aortocaval space.  Two small sub-cm indeterminate liver lesions. Early liver metastases cannot definitely be excluded.  Small right pleural effusion and diffuse body wall edema.  Critical Value/emergent results were called by telephone at the time of interpretation on 11/28/2014 at 2:55 pm to the patient's floor nurse Gwenn, who verbally acknowledged these results.   Electronically Signed   By: Earle Gell M.D.   On: 11/24/2014 14:48   Dg Chest Port 1 View  12/06/2014   CLINICAL DATA:   Hypoxia  EXAM: PORTABLE CHEST - 1 VIEW  COMPARISON:  December 05, 2014  FINDINGS: The endotracheal tube tip is 2.2 cm above the carina. Nasogastric tube tip and side port are coiled in the stomach. Central catheter tip is at the cavoatrial junction. No pneumothorax. There is mild right base atelectasis. Lungs are otherwise clear. Heart is borderline enlarged with pulmonary vascularity within normal limits. No adenopathy. No bone lesions.  IMPRESSION: Tube and catheter positions as described without pneumothorax. Lungs clear except for mild right base atelectatic change.   Electronically Signed   By: Lowella Grip III M.D.   On: 12/06/2014 07:21   Dg Chest Port 1 View  12/05/2014   CLINICAL DATA:  Acute respiratory failure.  EXAM: PORTABLE CHEST - 1 VIEW  COMPARISON:  Radiograph 12/04/2014  FINDINGS: Endotracheal tube is low at the carina and directed towards the right mainstem bronchus. The central venous line unchanged. NG tube in stomach. There is increased in left basilar atelectasis. Low lung volumes. No pulmonary edema.  IMPRESSION: 1. Endotracheal tube is at the carina and directed towards right mainstem bronchus. 2. Interval increase in right basilar atelectasis. 3. Placement of NG tube in stomach.   Electronically Signed   By: Suzy Bouchard M.D.   On: 12/05/2014 08:49   Dg Chest Port 1 View  12/04/2014   CLINICAL DATA:  Intubated.  EXAM: PORTABLE CHEST - 1 VIEW  COMPARISON:  11/28/2014  FINDINGS: Endotracheal tube is 2 cm above the carina. Left subclavian central line tip is in the upper right atrium, stable. Worsening aeration in the lungs with increasing bibasilar opacities, likely  atelectasis. Decreasing lung volumes. Small right pleural effusion.  IMPRESSION: Endotracheal tube tip 2 cm above the carina.  Increasing bibasilar atelectasis.  Small right effusion.   Electronically Signed   By: Rolm Baptise M.D.   On: 12/04/2014 15:16   Dg Chest Port 1 View  11/20/2014   CLINICAL DATA:   Central line placement.  EXAM: PORTABLE CHEST - 1 VIEW  COMPARISON:  11/22/2014  FINDINGS: Left Central line is in place with the tip in the upper right atrium. No pneumothorax. Low lung volumes with bibasilar atelectasis. Heart size is borderline, accentuated by the low volumes.  IMPRESSION: Low lung volumes, bibasilar atelectasis.  Left subclavian central line tip in the upper right atrium. No pneumothorax.   Electronically Signed   By: Rolm Baptise M.D.   On: 11/15/2014 21:47   Dg Chest Port 1 View  12/02/2014   CLINICAL DATA:  Sepsis, tachycardia  EXAM: PORTABLE CHEST - 1 VIEW  COMPARISON:  11/17/2014  FINDINGS: Low lung volumes.  Right basilar opacity, likely atelectasis. No pleural effusion or pneumothorax.  Cardiomegaly.  IMPRESSION: Low lung volumes with right basilar opacity, likely atelectasis.   Electronically Signed   By: Julian Hy M.D.   On: 11/26/2014 19:45   Dg Abd Portable 1v  11/16/2014   CLINICAL DATA:  Tachycardia, sepsis. Diffuse abdominal pain, left lower quadrant pain.  EXAM: PORTABLE ABDOMEN - 1 VIEW  COMPARISON:  CT 11/26/2014  FINDINGS: Mildly dilated small bowel loops are again noted in the abdomen and pelvis. Oral contrast material noted within the stomach can small bowel loops from prior oral contrast for CT. No free air. No organomegaly.  IMPRESSION: No significant change in bowel gas pattern since earlier CT. Dilated small bowel loops again noted. No free air.   Electronically Signed   By: Rolm Baptise M.D.   On: 11/17/2014 19:53   Ct Image Guided Drainage Percut Cath  Peritoneal Retroperit  12/06/2014   CLINICAL DATA:  Multiple abscess ease  EXAM: CT-guided abscess x2  FLUOROSCOPY TIME:  None  MEDICATIONS AND MEDICAL HISTORY: Patient was intubated  ANESTHESIA/SEDATION: Intubated  CONTRAST:  Nine  PROCEDURE: The procedure, risks, benefits, and alternatives were explained to the patient. Questions regarding the procedure were encouraged and answered. The patient  understands and consents to the procedure.  The back in the prone position was prepped with Betadine in a sterile fashion, and a sterile drape was applied covering the operative field. A sterile gown and sterile gloves were used for the procedure.  Under CT guidance, an 18 gauge needle was inserted into the mesenteric abscess via posterior left paraspinal approach. It was removed over an Amplatz. A 12 French dilator followed by a 12 [place] drain were inserted.  The right flank was then prepped and draped in a sterile fashion in the prone position. Under CT guidance, an 18 gauge needle was inserted into the abscess in the right pericolic gutter and removed over an Amplatz. A 12 French dilator followed by a 12 [place] drain were inserted. Drains were looped and string fixed and sewn to the skin. Pus was aspirated from both sites.  FINDINGS: Images document peritoneal drain placement x2 as described.  COMPLICATIONS: None  IMPRESSION: Successful peritoneal drain placement x2.   Electronically Signed   By: Marybelle Killings M.D.   On: 12/06/2014 08:08   Ct Image Guided Drainage Percut Cath  Peritoneal Retroperit  12/06/2014   CLINICAL DATA:  Multiple abscess ease  EXAM: CT-guided abscess x2  FLUOROSCOPY TIME:  None  MEDICATIONS AND MEDICAL HISTORY: Patient was intubated  ANESTHESIA/SEDATION: Intubated  CONTRAST:  Nine  PROCEDURE: The procedure, risks, benefits, and alternatives were explained to the patient. Questions regarding the procedure were encouraged and answered. The patient understands and consents to the procedure.  The back in the prone position was prepped with Betadine in a sterile fashion, and a sterile drape was applied covering the operative field. A sterile gown and sterile gloves were used for the procedure.  Under CT guidance, an 18 gauge needle was inserted into the mesenteric abscess via posterior left paraspinal approach. It was removed over an Amplatz. A 12 French dilator followed by a 12 [place] drain  were inserted.  The right flank was then prepped and draped in a sterile fashion in the prone position. Under CT guidance, an 18 gauge needle was inserted into the abscess in the right pericolic gutter and removed over an Amplatz. A 12 French dilator followed by a 12 [place] drain were inserted. Drains were looped and string fixed and sewn to the skin. Pus was aspirated from both sites.  FINDINGS: Images document peritoneal drain placement x2 as described.  COMPLICATIONS: None  IMPRESSION: Successful peritoneal drain placement x2.   Electronically Signed   By: Marybelle Killings M.D.   On: 12/06/2014 08:08    Labs:  CBC:  Recent Labs  12/02/2014 0555 11/20/2014 2155 12/05/14 0426 12/05/14 1007 12/06/14 0600  WBC 31.5* 18.5* 22.4*  --  22.5*  HGB 8.3* 8.4* 7.4*  --  6.7*  HCT 25.1*  25.9* 25.0* 21.9*  --  20.2*  PLT 388 278 39* 28* 12*    COAGS:  Recent Labs  12/01/2014 0555 11/17/2014 2155 12/05/14 1007  INR 1.25 1.34 1.31  APTT 36 36 40*    BMP:  Recent Labs  11/12/2014 0555 11/26/2014 2155 12/05/14 0426 12/06/14 0600  NA 136 133* 128* 132*  K 3.3* 3.7 3.7 3.8  CL 97 104 97 104  CO2 23 21 20 21   GLUCOSE 87 75 150* 121*  BUN 37* 38* 45* 44*  CALCIUM 6.9* 6.2* 6.1* 6.5*  CREATININE 1.20* 1.16* 1.30* 1.08  GFRNONAA 45* 47* 41* 51*  GFRAA 52* 54* 47* 59*    LIVER FUNCTION TESTS:  Recent Labs  11/15/2014 1601 12/02/2014 0555 11/17/2014 2155 12/06/14 1125  BILITOT 1.4* 1.4* 1.3* 2.6*  AST 21 19 22 31   ALT 14 12 12 25   ALKPHOS 87 100 80 140*  PROT 5.7* 5.2* 4.2* 3.9*  ALBUMIN 1.8* 1.5* 1.5* <1.0*    Assessment and Plan: Abdominal abscesses s/p perc drains (2) in IR 12/04/14 Large pelvic mass Acute respiratory failure, intubated Septic shock on Levophed Plans per CCS and Palliative care mtg to take place, now DNR per CCM   Signed: Hedy Jacob 12/06/2014, 3:42 PM   I spent a total of 15 Minutes in face to face in clinical consultation/evaluation, greater than 50% of  which was counseling/coordinating care for

## 2014-12-06 NOTE — Progress Notes (Signed)
Subjective: She is intubated, sedated and not really responsive right now.  She remains tachycardic.  On Levophed a t 17 mcg/kg/min.  Drainage from the left drain is feculent in appearance.  Drain on the right is yellow cloudy serous, looks purulent.    Objective: Vital signs in last 24 hours: Temp:  [97.6 F (36.4 C)-99.1 F (37.3 C)] 97.6 F (36.4 C) (02/29 0731) Pulse Rate:  [29-118] 92 (02/29 0700) Resp:  [11-29] 15 (02/29 0700) BP: (67-133)/(31-80) 102/66 mmHg (02/29 0700) SpO2:  [97 %-100 %] 100 % (02/29 0700) FiO2 (%):  [40 %] 40 % (02/29 0300) Weight:  [165 lb 12.6 oz (75.2 kg)] 165 lb 12.6 oz (75.2 kg) (02/29 0600) Last BM Date: 11/24/2014 1080 urine output 200 from drain #1 right posterior back 50 ml from drain #2 left posterior back Afebrile, hypotensive on norepinephrine Full vent support Na 132, WBC 22.5 H/H 6.7/20.6 Platelets 12K Intake/Output from previous day: 02/28 0701 - 02/29 0700 In: 4410 [I.V.:3417.5; NG/GT:260; IV Piggyback:532.5] Out: 1330 [Urine:1080; Drains:250] Intake/Output this shift:    General appearance: sedatedon the vent, they are waking her up some now, but she is not yet responsive..  Tachycardic, full vent support currently. Resp: course tube sounds bilat. GI: She is up in bed, but not responsive, No bowel sounds, dreains as noted above. Extremities: edema +2 both lower legs/ SCD's in place  Lab Results:   Recent Labs  12/05/14 0426 12/05/14 1007 12/06/14 0600  WBC 22.4*  --  22.5*  HGB 7.4*  --  6.7*  HCT 21.9*  --  20.2*  PLT 39* 28* 12*    BMET  Recent Labs  12/05/14 0426 12/06/14 0600  NA 128* 132*  K 3.7 3.8  CL 97 104  CO2 20 21  GLUCOSE 150* 121*  BUN 45* 44*  CREATININE 1.30* 1.08  CALCIUM 6.1* 6.5*   PT/INR  Recent Labs  12/02/2014 2155 12/05/14 1007  LABPROT 16.7* 16.4*  INR 1.34 1.31     Recent Labs Lab 11/23/2014 1601 12/02/2014 0555 11/29/2014 2155  AST 21 19 22   ALT 14 12 12   ALKPHOS 87 100  80  BILITOT 1.4* 1.4* 1.3*  PROT 5.7* 5.2* 4.2*  ALBUMIN 1.8* 1.5* 1.5*     Lipase     Component Value Date/Time   LIPASE 10* 11/30/2014 1601     Studies/Results: Ct Abdomen Wo Contrast  12/04/2014   CLINICAL DATA:  Abscess  EXAM: CT ABDOMEN WITHOUT CONTRAST  TECHNIQUE: Multidetector CT imaging of the abdomen was performed following the standard protocol without IV contrast.  COMPARISON:  Yesterday  FINDINGS: The patient is in the prone position.  The abscess containing gas and fluid in the mesenteric on image 47 is stable.  The large mass filling the pelvis on image 66 is stable. Fluid in the right pericolic gutter is stable.  There are dilated small bowel loops filled with contrast compatible with an element of obstruction or ileus.  Exam is otherwise stable.  IMPRESSION: Multiple abscess sees and pelvic mass are stable.  Ileus versus small bowel obstruction pattern.   Electronically Signed   By: Marybelle Killings M.D.   On: 12/04/2014 13:40   Ct Angio Chest Pe W/cm &/or Wo Cm  12/04/2014   CLINICAL DATA:  Elevated D-dimer level.  Shallow breathing.  EXAM: CT ANGIOGRAPHY CHEST WITH CONTRAST  TECHNIQUE: Multidetector CT imaging of the chest was performed using the standard protocol during bolus administration of intravenous contrast. Multiplanar CT image reconstructions  and MIPs were obtained to evaluate the vascular anatomy.  CONTRAST:  That information is not available and could not be obtained. Attempts to contact CT technologist who performed exam were unsuccessful.  COMPARISON:  Chest radiograph of December 03, 2014.  FINDINGS: No pneumothorax is noted. Moderate bilateral pleural effusions are noted with adjacent subsegmental atelectasis, right greater than left. There is no evidence of pulmonary embolus. There is no evidence of thoracic aortic dissection or aneurysm. No mediastinal mass or adenopathy is noted. Moderate anasarca is noted. Moderate to severe dilatation of the esophagus is noted  concerning for gastroesophageal junction stenosis. No significant osseous abnormality is noted. Ascites is noted in the visualized portion of the upper abdomen.  Review of the MIP images confirms the above findings.  IMPRESSION: No evidence of pulmonary embolus.  Moderate bilateral pleural effusions are noted with adjacent subsegmental atelectasis.  Moderate anasarca is noted.  Moderate to severe dilatation of the esophagus is noted concerning for distal esophageal narrowing or stricture.  Mild ascites is noted in the visualized portion the upper abdomen.   Electronically Signed   By: Marijo Conception, M.D.   On: 12/04/2014 14:10   Dg Chest Port 1 View  12/06/2014   CLINICAL DATA:  Hypoxia  EXAM: PORTABLE CHEST - 1 VIEW  COMPARISON:  December 05, 2014  FINDINGS: The endotracheal tube tip is 2.2 cm above the carina. Nasogastric tube tip and side port are coiled in the stomach. Central catheter tip is at the cavoatrial junction. No pneumothorax. There is mild right base atelectasis. Lungs are otherwise clear. Heart is borderline enlarged with pulmonary vascularity within normal limits. No adenopathy. No bone lesions.  IMPRESSION: Tube and catheter positions as described without pneumothorax. Lungs clear except for mild right base atelectatic change.   Electronically Signed   By: Lowella Grip III M.D.   On: 12/06/2014 07:21   Dg Chest Port 1 View  12/05/2014   CLINICAL DATA:  Acute respiratory failure.  EXAM: PORTABLE CHEST - 1 VIEW  COMPARISON:  Radiograph 12/04/2014  FINDINGS: Endotracheal tube is low at the carina and directed towards the right mainstem bronchus. The central venous line unchanged. NG tube in stomach. There is increased in left basilar atelectasis. Low lung volumes. No pulmonary edema.  IMPRESSION: 1. Endotracheal tube is at the carina and directed towards right mainstem bronchus. 2. Interval increase in right basilar atelectasis. 3. Placement of NG tube in stomach.   Electronically Signed    By: Suzy Bouchard M.D.   On: 12/05/2014 08:49   Dg Chest Port 1 View  12/04/2014   CLINICAL DATA:  Intubated.  EXAM: PORTABLE CHEST - 1 VIEW  COMPARISON:  11/12/2014  FINDINGS: Endotracheal tube is 2 cm above the carina. Left subclavian central line tip is in the upper right atrium, stable. Worsening aeration in the lungs with increasing bibasilar opacities, likely atelectasis. Decreasing lung volumes. Small right pleural effusion.  IMPRESSION: Endotracheal tube tip 2 cm above the carina.  Increasing bibasilar atelectasis.  Small right effusion.   Electronically Signed   By: Rolm Baptise M.D.   On: 12/04/2014 15:16    Medications: . antiseptic oral rinse  7 mL Mouth Rinse QID  . calcium gluconate  1 g Intravenous Once  . chlorhexidine  15 mL Mouth Rinse BID  . famotidine (PEPCID) IV  20 mg Intravenous Q24H  . insulin aspart  0-9 Units Subcutaneous 6 times per day  . piperacillin-tazobactam  3.375 g Intravenous 3 times per day  .  sodium chloride  3 mL Intravenous Q12H  . vancomycin  1,000 mg Intravenous QHS   . sodium chloride 30 mL/hr at 12/06/14 8242  . feeding supplement (VITAL HIGH PROTEIN)    . fentaNYL infusion INTRAVENOUS 150 mcg/hr (12/06/14 0005)  . 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  Continue supportive care, and discuss with Dr. Redmond Pulling who is on this week.     LOS: 4 days    Dua Mehler 12/06/2014

## 2014-12-06 NOTE — Progress Notes (Signed)
Long discussion w/ sons.  Have agreed to DNR w/ no escalation. They both understand the current situation. Very little to offer and surgery would only be palliative to divert contamination. They need to discuss things more. For now we will cont current level of care.  Plan Cont current level of care No escalation  DNR   I suspect that if things get worse they will lean towards w/d, at this point both son's need time for this discussion to sink in.   Erick Colace ACNP-BC [place] Pager # [phone] OR # [phone] if no answer

## 2014-12-06 NOTE — Progress Notes (Signed)
Per RN from NP- place PT back on full support- completed at 0945- documentation in EPIC.

## 2014-12-06 NOTE — Progress Notes (Addendum)
Noted episodes of SVT- A-fib starting around 1850.  HR ranging from 90-190 bpm.  Comfort care provided, pt sedated on Fentanyl gtt.  Plan is for no escalation of care.

## 2014-12-06 NOTE — Progress Notes (Signed)
PULMONARY / CRITICAL CARE MEDICINE HISTORY AND PHYSICAL EXAMINATION   Name: Nicole Cannon MRN: [id number] DOB: [birth year]    ADMISSION DATE:  11/18/2014  PRIMARY SERVICE: PCCM  CHIEF COMPLAINT:  Weakness, abdominal distension  BRIEF PATIENT DESCRIPTION: 69 year old woman with an abdominal mass, malnutrition, and septic shock.  SIGNIFICANT STUDIES:  2/27 CT Ab> Large 16cm mass in lower abdomen and pelvis causing distal obstruction, suspicious for colon carcinoma, consider uterin or ovarian carcinoma, 9cm extraluminal gass in small bowel mesentery, consistent with localise abscess, no peritoneal free air, small liver lesions.  Mild retroperitoneal lymphadenopathy, small right pleural effusion 2/27 TTE> LVEF 60%, grade 1 DD, mitral valve annulus calcified, small pericardial effusion  SIGNIFICANT EVENTS : 2/27 IR guided abscess drainage, required intubation  SUBJECTIVE:   Remains in shock   VITAL SIGNS: Temp:  [97.6 F (36.4 C)-99.1 F (37.3 C)] 97.6 F (36.4 C) (02/29 0731) Pulse Rate:  [86-118] 108 (02/29 0800) Resp:  [11-28] 13 (02/29 0800) BP: (67-133)/(31-80) 99/32 mmHg (02/29 0800) SpO2:  [97 %-100 %] 100 % (02/29 0800) FiO2 (%):  [40 %] 40 % (02/29 0742) Weight:  [75.2 kg (165 lb 12.6 oz)] 75.2 kg (165 lb 12.6 oz) (02/29 0600) HEMODYNAMICS: CVP:  [4 mmHg-9 mmHg] 4 mmHg VENTILATOR SETTINGS: Vent Mode:  [-] CPAP;PSV FiO2 (%):  [40 %] 40 % Set Rate:  [14 bmp] 14 bmp Vt Set:  [400 mL] 400 mL PEEP:  [5 cmH20] 5 cmH20 Pressure Support:  [5 PJS31-59 cmH20] 10 cmH20 Plateau Pressure:  [7 cmH20-19 cmH20] 19 cmH20 INTAKE / OUTPUT: Intake/Output      02/28 0701 - 02/29 0700 02/29 0701 - 03/01 0700   I.V. (mL/kg) 3515 (46.7) 90 (1.2)   Other 200    NG/GT 270 10   IV Piggyback 532.5    Total Intake(mL/kg) 4517.5 (60.1) 100 (1.3)   Urine (mL/kg/hr) 1080 (0.6) 100 (0.7)   Emesis/NG output     Drains 250 (0.1)    Total Output 1330 100   Net +3187.5 0          PHYSICAL  EXAMINATION: General:  Sedated on vent HEENT: NCAT, ETT PULM: scattered rhonchi  CV: RRR, no mgr Ab: mildly distended, tender to touch, left drain w/ feculent material and right w/ purulent  Ext: warm, massive edema throughout Neuro: A&Ox4, maew  LABS:  CBC  Recent Labs Lab 11/20/2014 2155 12/05/14 0426 12/05/14 1007 12/06/14 0600  WBC 18.5* 22.4*  --  22.5*  HGB 8.4* 7.4*  --  6.7*  HCT 25.0* 21.9*  --  20.2*  PLT 278 39* 28* 12*   Coag's  Recent Labs Lab 11/29/2014 0555 11/29/2014 2155 12/05/14 1007  APTT 36 36 40*  INR 1.25 1.34 1.31   BMET  Recent Labs Lab 12/05/2014 2155 12/05/14 0426 12/06/14 0600  NA 133* 128* 132*  K 3.7 3.7 3.8  CL 104 97 104  CO2 21 20 21   BUN 38* 45* 44*  CREATININE 1.16* 1.30* 1.08  GLUCOSE 75 150* 121*   Electrolytes  Recent Labs Lab 11/27/2014 0555 11/18/2014 2155 12/05/14 0426 12/06/14 0600  CALCIUM 6.9* 6.2* 6.1* 6.5*  MG 1.7  --   --   --    Sepsis Markers  Recent Labs Lab 12/02/14 1615 11/12/2014 2141 11/10/2014 2155 12/04/14 1830  LATICACIDVEN 2.85* 2.3*  --  2.1*  PROCALCITON  --   --  23.62  --    ABG  Recent Labs Lab 12/04/14 1504  PHART 7.341*  PCO2ART 36.6  PO2ART 299.0*   Liver Enzymes  Recent Labs Lab 11/22/2014 1601 11/12/2014 0555 11/16/2014 2155  AST 21 19 22   ALT 14 12 12   ALKPHOS 87 100 80  BILITOT 1.4* 1.4* 1.3*  ALBUMIN 1.8* 1.5* 1.5*   Cardiac Enzymes  Recent Labs Lab 11/17/2014 0555 11/23/2014 2155  TROPONINI <0.03 <0.03   Glucose  Recent Labs Lab 12/05/14 0412 12/05/14 0751 12/05/14 1154 12/05/14 1641 12/05/14 1946 12/05/14 2341  GLUCAP 144* 142* 129* 106* 93 112*    Imaging   EKG: Sinus Tachycardia, inferior q-waves suggestive of prior infarction. 2/28 CXR: RLL atelectasis no sig change   ASSESSMENT / PLAN:  Principal Problem:   Severe sepsis with septic shock Active Problems:   Anasarca   Pelvic mass in female   Hypokalemia   Renal insufficiency    Protein-calorie malnutrition, severe   Bowel perforation   Abscess of gastrointestinal tract   Abdominal mass   Septic shock   Acute respiratory failure with hypoxia   PULMONARY A: Acute Hypoxemic Respiratory failure> no clear infiltrate, failed SBT 2/28, weakness? Given persistent shock will hold off on weaning P:   Full vent support Daily CXR Daily SBT/WUA  CARDIOVASCULAR A: Septic shock, adequately resuscitated, on high dose pressors Abnormal EKG, old Q wave but Echo OK LINES / TUBES: Left [place] CVC - 12/01/2014 > P:   Transfuse 2/29 CVP goal 8-12 Levophed for MAP > 65  RENAL A: AKI, possible CKD-->improved  Hypocalcemia> corrected calcium still low P:   KVO IVF Monitor BMET and UOP Replace electrolytes as needed   GASTROINTESTINAL A: Severe protein calorie malnutrition Abdominal mass Abdominal abscess > s/p drain x2 with purulent drainage P:   Appreciate Dr Dois Davenport assistance here> pt has carcinomatosis, abscesses and perforation. Her ability to tolerate a sgy or to have a good outcome from sgy is extremely compromised Monitor drain output Discussed nutrition with surgery, will stop tubefeeds as now concern for colonic fistula / perf.  HEMATOLOGIC A: Possible colon or GYN malignancy (suspect uterine) Anemia, w/ progressive hgb decline  Thrombocytopenia > repeat value consistent, unclear etiology, likely septic coagulopathy, continues to worsen  P:   Would need colonoscopy if peritonitis can be stabilized medically Hold heparin Transfuse platelets if drops less than 10 OR if repeat procedures required  SCD Transfuse blood today (2/29) She is not / will not be a candidate for surgery and I do not believe she would survive attempts at salvage chemo, etc.   INFECTIOUS A: Septic Shock, due to intra-abdominal abscess  CULTURES: Blood - 2/26 - pending Wound 2/27 - multiple organisms>>> Peritoneal culture 2/27: GNR,GPR, GPC>>>> P:   ANTIBIOTICS: Pip-tazo:  2/25>>> Vanc: 2/26>>> F/u cultures Drains in place  ENDOCRINE A: No active issues P:   Trend glucose   NEUROLOGIC A: Acute encephalopathy  P: PAD protocol    TODAY'S SUMMARY: 69 y/o woman with abdominal distention and septic shock due to abdominal abscesses, failed SBT 2/28, persistent shock. PLTs trending down. As has Hgb, but not significantly. We will transfuse PRBCs today. Repeat CBC later after Xfusion. If PLTs <10K will transfuse these as well. No change in abx at this time. Will await culture data. Not a surgical candidate - she would not survive. Will need to discus goals of care w/ pt.   Erick Colace ACNP-BC [place] Pager # [phone] OR # [phone] if no answer  Attending Note:  I have examined patient, reviewed labs, studies and notes. I have  discussed the case with Jerrye Bushy, and I agree with the data and plans as amended above. Have also reviewd findings with Dr Redmond Pulling w [place]. 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 Drains are in place. On exam 2/29 one of them now is draining feculent material suggestive of fistula / perf.  She remains in severe septic shock. I do not believe she would survive surgery (either for her perf or for her mass). Likewise she would probably not tolerate chemotherapy even if we were able to manage this problem medically. We will undertake discussions with her sons regarding goals for care, palliation.  Independent critical care time is 45 minutes.   Baltazar Apo, MD, PhD 12/06/2014, 12:00 PM [facility] [phone] or if no answer [phone]

## 2014-12-06 NOTE — Progress Notes (Signed)
eLink Physician-Brief Progress Note Patient Name: Nicole Cannon DOB: April 14, 1946 MRN: [id number]   Date of Service  12/06/2014  HPI/Events of Note  Rn called to assess tachycardia. Pt now NO escallation on levo max  eICU Interventions  NO role BB Rn feels no pain or anxiety driving Monitor for pain  And treat  iof present     Intervention Category Intermediate Interventions: Communication with other healthcare providers and/or family  Raylene Miyamoto. 12/06/2014, 9:56 PM

## 2014-12-06 NOTE — Care Management Note (Signed)
CARE MANAGEMENT NOTE 12/06/2014  Patient:  AUGUST, GOSSER   Account Number:  [id number]  Date Initiated:  12/06/2014  Documentation initiated by:  Mayte Diers  Subjective/Objective Assessment:   day 4 of vent:Large 16cm mass in lower abdomen and pelvis causing distal obstruction, suspicious for colon carcinoma, consider uterin or ovarian carcinoma, 9cm extraluminal gass in small bowel mesentery, consistent with localise abscess,     Action/Plan:   tbd rmains on full vetn syupport   Anticipated DC Date:  12/09/2014   Anticipated DC Plan:    In-house referral  Clinical Social Worker      DC Planning Services  CM consult      [facility] Choice  NA   Choice offered to / List presented to:  NA   DME arranged  NA      DME agency  NA     [place] arranged  NA      [place] agency  NA   Status of service:  In process, will continue to follow Medicare Important Message given?   (If response is "NO", the following Medicare IM given date fields will be blank) Date Medicare IM given:   Medicare IM given by:   Date Additional Medicare IM given:   Additional Medicare IM given by:    Discharge Disposition:    Per UR Regulation:  Reviewed for med. necessity/level of care/duration of stay  If discussed at [place] of Stay Meetings, dates discussed:    Comments:  Feb. 29 2016/Raul Torrance L. Rosana Hoes, RN, BSN, CCM. Case Management [place] [phone] No discharge needs present of time of review.

## 2014-12-06 NOTE — Progress Notes (Signed)
Rx Brief Antibiotic note:  IV Vancomycin   See full note 2/29 1023am  Assessement:  2100 VT=18.9 after 1Gm IV q24h @ C[facility].  Afebrile/WBCs WNL  Plan:   Continue Vancomycin 1Gm IV q24h  F/u scr/cultures/additional levels as needed   Dorrene German 12/06/2014 10:44 PM

## 2014-12-07 DIAGNOSIS — K651 Peritoneal abscess: Secondary | ICD-10-CM

## 2014-12-07 LAB — GLUCOSE, CAPILLARY
GLUCOSE-CAPILLARY: 105 mg/dL — AB (ref 70–99)
GLUCOSE-CAPILLARY: 147 mg/dL — AB (ref 70–99)
Glucose-Capillary: 115 mg/dL — ABNORMAL HIGH (ref 70–99)
Glucose-Capillary: 98 mg/dL (ref 70–99)
Glucose-Capillary: 105 mg/dL — ABNORMAL HIGH (ref 70–99)

## 2014-12-07 LAB — TYPE AND SCREEN
ABO/RH(D): O POS
Antibody Screen: NEGATIVE
UNIT DIVISION: 0

## 2014-12-07 LAB — BLOOD GAS, ARTERIAL
ACID-BASE DEFICIT: 7.2 mmol/L — AB (ref 0.0–2.0)
Bicarbonate: 19 mEq/L — ABNORMAL LOW (ref 20.0–24.0)
Drawn by: 308601
FIO2: 0.4 %
O2 Saturation: 96.7 %
PEEP/CPAP: 5 cmH2O
Patient temperature: 98.6
RATE: 14 resp/min
TCO2: 18.5 mmol/L (ref 0–100)
VT: 400 mL
pCO2 arterial: 44.4 mmHg (ref 35.0–45.0)
pH, Arterial: 7.255 — ABNORMAL LOW (ref 7.350–7.450)
pO2, Arterial: 165 mmHg — ABNORMAL HIGH (ref 80.0–100.0)

## 2014-12-07 LAB — COMPREHENSIVE METABOLIC PANEL
ALK PHOS: 159 U/L — AB (ref 39–117)
ALT: 24 U/L (ref 0–35)
AST: 33 U/L (ref 0–37)
Albumin: 1 g/dL — ABNORMAL LOW (ref 3.5–5.2)
Anion gap: 8 (ref 5–15)
BUN: 42 mg/dL — ABNORMAL HIGH (ref 6–23)
CO2: 21 mmol/L (ref 19–32)
Calcium: 6.7 mg/dL — ABNORMAL LOW (ref 8.4–10.5)
Chloride: 102 mmol/L (ref 96–112)
Creatinine, Ser: 1.09 mg/dL (ref 0.50–1.10)
GFR calc Af Amer: 58 mL/min — ABNORMAL LOW (ref >90)
GFR calc non Af Amer: 50 mL/min — ABNORMAL LOW (ref >90)
GLUCOSE: 113 mg/dL — AB (ref 70–99)
POTASSIUM: 4 mmol/L (ref 3.5–5.1)
Sodium: 131 mmol/L — ABNORMAL LOW (ref 135–145)
Total Bilirubin: 4 mg/dL — ABNORMAL HIGH (ref 0.3–1.2)
Total Protein: 4.1 g/dL — ABNORMAL LOW (ref 6.0–8.3)

## 2014-12-07 LAB — CBC
HEMATOCRIT: 26.4 % — AB (ref 36.0–46.0)
HEMOGLOBIN: 8.8 g/dL — AB (ref 12.0–15.0)
MCH: 27.7 pg (ref 26.0–34.0)
MCHC: 33.3 g/dL (ref 30.0–36.0)
MCV: 83 fL (ref 78.0–100.0)
Platelets: 7 10*3/uL — CL (ref 150–400)
RBC: 3.18 MIL/uL — ABNORMAL LOW (ref 3.87–5.11)
RDW: 18 % — AB (ref 11.5–15.5)
WBC: 20.3 10*3/uL — AB (ref 4.0–10.5)

## 2014-12-07 LAB — BODY FLUID CULTURE

## 2014-12-07 LAB — CULTURE, ROUTINE-ABSCESS

## 2014-12-07 MED ORDER — NOREPINEPHRINE BITARTRATE 1 MG/ML IV SOLN
0.0000 ug/min | INTRAVENOUS | Status: DC
  Administered 2014-12-07: 22 ug/min via INTRAVENOUS
  Administered 2014-12-08: 5 ug/min via INTRAVENOUS
  Filled 2014-12-07 (×3): qty 16

## 2014-12-07 NOTE — Plan of Care (Signed)
Problem: Phase I Progression Outcomes Goal: VTE prophylaxis Outcome: Completed/Met Date Met:  12/07/14 SCDs Goal: GIProphysixis Outcome: Completed/Met Date Met:  12/07/14 IV Protonix

## 2014-12-07 NOTE — Progress Notes (Signed)
Subjective: Pt still intubated, not f/c  Objective: Vital signs in last 24 hours: Temp:  [97.8 F (36.6 C)-98.3 F (36.8 C)] 98.1 F (36.7 C) (03/01 1200) Pulse Rate:  [30-165] 87 (03/01 1415) Resp:  [9-18] 13 (03/01 1415) BP: (80-153)/(32-105) 108/44 mmHg (03/01 1415) SpO2:  [95 %-100 %] 100 % (03/01 1415) FiO2 (%):  [30 %-40 %] 30 % (03/01 1237) Weight:  [165 lb 9.1 oz (75.1 kg)] 165 lb 9.1 oz (75.1 kg) (03/01 0400) Last BM Date:  (NO grimacing noted upon palpation, pt sedated.)  Intake/Output from previous day: 02/29 0701 - 03/01 0700 In: 3759.6 [I.V.:3054.6; Blood:335; NG/GT:60; IV Piggyback:300] Out: 1620 [Urine:1400; Drains:220] Intake/Output this shift: Total I/O In: 1008 [I.V.:928; Other:30; IV Piggyback:50] Out: 1010 [Urine:1010] Rt abd/flank drain output 150 cc's yellow fluid; left flank drain output 70 cc's feculent material ; cx's- group f strept  Lab Results:   Recent Labs  12/06/14 1620 12/07/14 0300  WBC 6.0 20.3*  HGB 7.8* 8.8*  HCT 25.4* 26.4*  PLT 98* 7*   BMET  Recent Labs  12/06/14 0600 12/07/14 0300  NA 132* 131*  K 3.8 4.0  CL 104 102  CO2 21 21  GLUCOSE 121* 113*  BUN 44* 42*  CREATININE 1.08 1.09  CALCIUM 6.5* 6.7*   PT/INR  Recent Labs  12/05/14 1007  LABPROT 16.4*  INR 1.31   ABG  Recent Labs  12/07/14 0352  PHART 7.255*  HCO3 19.0*    Studies/Results: Dg Chest Port 1 View  12/06/2014   CLINICAL DATA:  Hypoxia  EXAM: PORTABLE CHEST - 1 VIEW  COMPARISON:  December 05, 2014  FINDINGS: The endotracheal tube tip is 2.2 cm above the carina. Nasogastric tube tip and side port are coiled in the stomach. Central catheter tip is at the cavoatrial junction. No pneumothorax. There is mild right base atelectasis. Lungs are otherwise clear. Heart is borderline enlarged with pulmonary vascularity within normal limits. No adenopathy. No bone lesions.  IMPRESSION: Tube and catheter positions as described without pneumothorax. Lungs  clear except for mild right base atelectatic change.   Electronically Signed   By: Lowella Grip III M.D.   On: 12/06/2014 07:21    Anti-infectives: Anti-infectives    Start     Dose/Rate Route Frequency Ordered Stop   11/09/2014 2230  vancomycin (VANCOCIN) IVPB 1000 mg/200 mL premix     1,000 mg 200 mL/hr over 60 Minutes Intravenous Daily at bedtime 11/14/2014 2145     11/20/2014 2200  piperacillin-tazobactam (ZOSYN) IVPB 3.375 g  Status:  Discontinued     3.375 g 100 mL/hr over 30 Minutes Intravenous  Once 11/09/2014 2145 11/21/2014 2149   11/26/2014 2200  piperacillin-tazobactam (ZOSYN) IVPB 3.375 g     3.375 g 12.5 mL/hr over 240 Minutes Intravenous 3 times per day 11/15/2014 2146     11/11/2014 1915  piperacillin-tazobactam (ZOSYN) IVPB 3.375 g  Status:  Discontinued     3.375 g 100 mL/hr over 30 Minutes Intravenous  Once 11/27/2014 1913 11/16/2014 1918   11/22/2014 1800  ertapenem (INVANZ) 1 g in sodium chloride 0.9 % 50 mL IVPB  Status:  Discontinued     1 g 100 mL/hr over 30 Minutes Intravenous Every 24 hours 11/12/2014 1542 11/29/2014 2145   11/18/2014 0400  piperacillin-tazobactam (ZOSYN) IVPB 3.375 g  Status:  Discontinued     3.375 g 12.5 mL/hr over 240 Minutes Intravenous Every 8 hours 11/22/2014 2337 12/05/2014 1542   11/30/2014 2000  piperacillin-tazobactam (ZOSYN) IVPB  3.375 g     3.375 g 12.5 mL/hr over 240 Minutes Intravenous  Once 11/19/2014 1952 11/15/2014 2245      Assessment/Plan: s/p drainage x2 peritoneal abscesses 2/29; plans as outlined by CCM/CCS  LOS: 5 days   15 minutes were spent evaluating pt for abd drain(s) care Aaron Bostwick,D [facility] 12/07/2014

## 2014-12-07 NOTE — Progress Notes (Signed)
Subjective: She is sedated, but woke up and looked at me while examing her.  She seems more edematous upper extremities, lower legs have been swollen.  Drains are the same.  Feculent material from the left and cloudy serous on the right side.  She is on max levophed, but currently seems comfortable, HR in the upper 80's now.  She had some SVT earlier last evening.    Objective: Vital signs in last 24 hours: Temp:  [97.8 F (36.6 C)-98.4 F (36.9 C)] 98.1 F (36.7 C) (03/01 0400) Pulse Rate:  [30-165] 84 (03/01 0600) Resp:  [9-18] 14 (03/01 0600) BP: (80-153)/(32-88) 105/46 mmHg (03/01 0645) SpO2:  [99 %-100 %] 100 % (03/01 0600) FiO2 (%):  [35 %-40 %] 35 % (03/01 0726) Weight:  [165 lb 9.1 oz (75.1 kg)] 165 lb 9.1 oz (75.1 kg) (03/01 0400) Last BM Date:  (NO grimacing noted upon palpation, pt sedated.) 150 from right drain and 70 from left drain yesterday. 60 ml thru NG yesterday - trickle feed. Afebrile, VSS . sodium chloride 30 mL/hr at 12/06/14 0652  . feeding supplement (VITAL HIGH PROTEIN) Stopped (12/06/14 1342)  . fentaNYL infusion INTRAVENOUS 200 mcg/hr (12/07/14 0700)  . norepinephrine (LEVOPHED) Adult infusion 23 mcg/min (12/07/14 0700)  labs: 7.25 this AM Na 131, creatinine stable Prealbumin yesterday < 3.0  WBC up to 20.3 this AM, H/H better after transfusion yesterday Intake/Output from previous day: 02/29 0701 - 03/01 0700 In: 3623.3 [I.V.:2918.3; Blood:335; NG/GT:60; IV Piggyback:300] Out: 1620 [Urine:1400; Drains:220] Intake/Output this shift:    General appearance: Sedated on Vent but opens her eyes during exam. Resp: clear anterior Cardio: currently in sinus in the upper 80's and good BP on Levophed GI: No BS, drains about the same as note above.  Extremities: both lower legs and arms look edematous.    Lab Results:   Recent Labs  12/06/14 1620 12/07/14 0300  WBC 6.0 20.3*  HGB 7.8* 8.8*  HCT 25.4* 26.4*  PLT 98* 7*    BMET  Recent Labs  12/06/14 0600 12/07/14 0300  NA 132* 131*  K 3.8 4.0  CL 104 102  CO2 21 21  GLUCOSE 121* 113*  BUN 44* 42*  CREATININE 1.08 1.09  CALCIUM 6.5* 6.7*   PT/INR  Recent Labs  12/05/14 1007  LABPROT 16.4*  INR 1.31     Recent Labs Lab 11/13/2014 1601 11/25/2014 0555 11/20/2014 2155 12/06/14 1125 12/07/14 0300  AST 21 19 22 31  33  ALT 14 12 12 25 24   ALKPHOS 87 100 80 140* 159*  BILITOT 1.4* 1.4* 1.3* 2.6* 4.0*  PROT 5.7* 5.2* 4.2* 3.9* 4.1*  ALBUMIN 1.8* 1.5* 1.5* <1.0* 1.0*     Lipase     Component Value Date/Time   LIPASE 10* 11/11/2014 1601     Studies/Results: Dg Chest Port 1 View  12/06/2014   CLINICAL DATA:  Hypoxia  EXAM: PORTABLE CHEST - 1 VIEW  COMPARISON:  December 05, 2014  FINDINGS: The endotracheal tube tip is 2.2 cm above the carina. Nasogastric tube tip and side port are coiled in the stomach. Central catheter tip is at the cavoatrial junction. No pneumothorax. There is mild right base atelectasis. Lungs are otherwise clear. Heart is borderline enlarged with pulmonary vascularity within normal limits. No adenopathy. No bone lesions.  IMPRESSION: Tube and catheter positions as described without pneumothorax. Lungs clear except for mild right base atelectatic change.   Electronically Signed   By: Lowella Grip III M.D.  On: 12/06/2014 07:21    Medications: . sodium chloride   Intravenous Once  . antiseptic oral rinse  7 mL Mouth Rinse QID  . calcium gluconate  1 g Intravenous Once  . chlorhexidine  15 mL Mouth Rinse BID  . insulin aspart  0-9 Units Subcutaneous 6 times per day  . pantoprazole (PROTONIX) IV  40 mg Intravenous QHS  . piperacillin-tazobactam  3.375 g Intravenous 3 times per day  . sodium chloride  3 mL Intravenous Q12H  . 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  CCM has talked with the sons and she is a DNR. No change in drainage.    LOS: 5 days    Nicole Cannon 12/07/2014

## 2014-12-07 NOTE — Progress Notes (Signed)
NUTRITION FOLLOW-UP  INTERVENTION: - Once medically able, recommend resuming Vital HP trickle feeds @ 10 ml/hr via OGT  - TF advancement per Surgery  - Once medically able, recommend increasing TF by 10 ml every 8 hours to goal rate of 55 ml/hr.     Tube feeding regimen provides 1320 kcal (93% of needs), 116 grams of protein, and 1104 ml of H2O.   Due to risk of refeeding, recommend conservative advancement of Vital HP. Pt is severely malnourished with poor PO intake PTA.  RD to continue to monitor.  NUTRITION DIAGNOSIS: Inadequate oral intake related to poor appetite as evidenced by abdominal pain for 2-3 months; ongoing  Goal: Pt to meet >/= 90% of estimated needs; not met  Monitor:  PO intake when diet advanced, weight trends, labs  ASSESSMENT: Pt presents with generalized weakness and swelling in her legs for the past 2 weeks.  Noted to be hypokalemic and have mild renal insufficiency.  CT scan showed pelvic mass ? Pelvic carcinomatosis?   Pt hx of IBS.    Patient is currently intubated on ventilator support MV: 7.5 L/min Temp (24hrs), Avg:98.1 F (36.7 C), Min:97.8 F (36.6 C), Max:98.4 F (36.9 C)  Propofol: none  Per RN, pt with no bowel sounds.  TF held due to concern for colonic fistula/perforation.  Pt made DNR Labs reviewed Na low  Pt meets criteria for severe MALNUTRITION in the context of chronic illness as evidenced by less than >75% of PO intake in the past month, moderate to severe fat and muscle wasting and severe fluid accumulation.   Height: Ht Readings from Last 1 Encounters:  11/09/2014 _0  (1.575 m)    Weight: Wt Readings from Last 1 Encounters:  12/07/14 165 lb 9.1 oz (75.1 kg)   BMI:  Body mass index is 30.27 kg/(m^2).  Estimated Nutritional Needs: Kcal: 1374 Protein: 105-115 g  Fluid: >/= 1.5 L/day  Skin: WDL  Diet Order: Diet NPO time specified  EDUCATION NEEDS: -No education needs identified at this time   Intake/Output  Summary (Last 24 hours) at 12/07/14 1049 Last data filed at 12/07/14 1000  Gross per 24 hour  Intake 3512.77 ml  Output   2120 ml  Net 1392.77 ml    Last BM: 2/28    Labs:   Recent Labs Lab 11/26/2014 0555  12/05/14 0426 12/06/14 0600 12/07/14 0300  NA 136  < > 128* 132* 131*  K 3.3*  < > 3.7 3.8 4.0  CL 97  < > 97 104 102  CO2 23  < > _1 BUN 37*  < > 45* 44* 42*  CREATININE 1.20*  < > 1.30* 1.08 1.09  CALCIUM 6.9*  < > 6.1* 6.5* 6.7*  MG 1.7  --   --   --   --   GLUCOSE 87  < > 150* 121* 113*  < > = values in this interval not displayed.  CBG (last 3)   Recent Labs  12/06/14 2350 12/07/14 0308 12/07/14 0746  GLUCAP 147* 115* 98    Scheduled Meds: . sodium chloride   Intravenous Once  . antiseptic oral rinse  7 mL Mouth Rinse QID  . calcium gluconate  1 g Intravenous Once  . chlorhexidine  15 mL Mouth Rinse BID  . insulin aspart  0-9 Units Subcutaneous 6 times per day  . pantoprazole (PROTONIX) IV  40 mg Intravenous QHS  . piperacillin-tazobactam  3.375 g Intravenous 3 times per day  .  sodium chloride  3 mL Intravenous Q12H  . vancomycin  1,000 mg Intravenous QHS    Continuous Infusions: . sodium chloride 30 mL/hr at 12/06/14 0652  . feeding supplement (VITAL HIGH PROTEIN) Stopped (12/06/14 1342)  . fentaNYL infusion INTRAVENOUS 125 mcg/hr (12/07/14 1046)  . norepinephrine (LEVOPHED) Adult infusion 22 mcg/min (12/07/14 1043)    Laurette Schimke [place], [place], [place]

## 2014-12-07 NOTE — Progress Notes (Signed)
PULMONARY / CRITICAL CARE MEDICINE HISTORY AND PHYSICAL EXAMINATION   Name: Nicole Cannon MRN: [id number] DOB: 1946/01/30    ADMISSION DATE:  11/28/2014  PRIMARY SERVICE: PCCM  CHIEF COMPLAINT:  Weakness, abdominal distension  BRIEF PATIENT DESCRIPTION: 69 year old woman with an abdominal mass, malnutrition, and septic shock.  SIGNIFICANT STUDIES:  2/27 CT Ab> Large 16cm mass in lower abdomen and pelvis causing distal obstruction, suspicious for colon carcinoma, consider uterin or ovarian carcinoma, 9cm extraluminal gass in small bowel mesentery, consistent with localise abscess, no peritoneal free air, small liver lesions.  Mild retroperitoneal lymphadenopathy, small right pleural effusion 2/27 TTE> LVEF 60%, grade 1 DD, mitral valve annulus calcified, small pericardial effusion  SIGNIFICANT EVENTS : 2/27 IR guided abscess drainage, required intubation 2/29: long d/w sons. Made her DNR w/ no escalation   SUBJECTIVE:   Remains in shock   VITAL SIGNS: Temp:  [97.8 F (36.6 C)-98.4 F (36.9 C)] 98.1 F (36.7 C) (03/01 0400) Pulse Rate:  [30-165] 91 (03/01 0700) Resp:  [9-18] 14 (03/01 0700) BP: (80-153)/(32-88) 115/51 mmHg (03/01 0700) SpO2:  [99 %-100 %] 99 % (03/01 0700) FiO2 (%):  [35 %-40 %] 35 % (03/01 0726) Weight:  [75.1 kg (165 lb 9.1 oz)] 75.1 kg (165 lb 9.1 oz) (03/01 0400) HEMODYNAMICS: CVP:  [4 mmHg-12 mmHg] 12 mmHg VENTILATOR SETTINGS: Vent Mode:  [-] PRVC FiO2 (%):  [35 %-40 %] 35 % Set Rate:  [14 bmp] 14 bmp Vt Set:  [400 mL] 400 mL PEEP:  [5 cmH20] 5 cmH20 Plateau Pressure:  [14 cmH20-21 cmH20] 18 cmH20 INTAKE / OUTPUT: Intake/Output      02/29 0701 - 03/01 0700 03/01 0701 - 03/02 0700   I.V. (mL/kg) 3054.6 (40.7)    Blood 335    Other 10    NG/GT 60    IV Piggyback 300    Total Intake(mL/kg) 3759.6 (50.1)    Urine (mL/kg/hr) 1400 (0.8)    Drains 220 (0.1)    Total Output 1620     Net +2139.6            PHYSICAL EXAMINATION: General:  Sedated  on vent, unresponsive  HEENT: NCAT, ETT PULM: scattered rhonchi  CV: RRR, no mgr Ab: mildly distended, tender to touch, left drain w/ feculent material and right w/ purulent  Ext: warm, diffuse and massive anasarca  Neuro: A&Ox4, maew  LABS:  CBC  Recent Labs Lab 12/06/14 0600 12/06/14 1620 12/07/14 0300  WBC 22.5* 6.0 20.3*  HGB 6.7* 7.8* 8.8*  HCT 20.2* 25.4* 26.4*  PLT 12* 98* 7*   Coag's  Recent Labs Lab 12/01/2014 0555 11/17/2014 2155 12/05/14 1007  APTT 36 36 40*  INR 1.25 1.34 1.31   BMET  Recent Labs Lab 12/05/14 0426 12/06/14 0600 12/07/14 0300  NA 128* 132* 131*  K 3.7 3.8 4.0  CL 97 104 102  CO2 20 21 21   BUN 45* 44* 42*  CREATININE 1.30* 1.08 1.09  GLUCOSE 150* 121* 113*   Electrolytes  Recent Labs Lab 11/16/2014 0555  12/05/14 0426 12/06/14 0600 12/07/14 0300  CALCIUM 6.9*  < > 6.1* 6.5* 6.7*  MG 1.7  --   --   --   --   < > = values in this interval not displayed. Sepsis Markers  Recent Labs Lab 12/02/14 1615 11/14/2014 2141 11/12/2014 2155 12/04/14 1830  LATICACIDVEN 2.85* 2.3*  --  2.1*  PROCALCITON  --   --  23.62  --    ABG  Recent Labs Lab 12/04/14 1504 12/07/14 0352  PHART 7.341* 7.255*  PCO2ART 36.6 44.4  PO2ART 299.0* 165.0*   Liver Enzymes  Recent Labs Lab 11/25/2014 2155 12/06/14 1125 12/07/14 0300  AST 22 31 33  ALT 12 25 24   ALKPHOS 80 140* 159*  BILITOT 1.3* 2.6* 4.0*  ALBUMIN 1.5* <1.0* 1.0*   Cardiac Enzymes  Recent Labs Lab 11/16/2014 0555 11/27/2014 2155  TROPONINI <0.03 <0.03   Glucose  Recent Labs Lab 12/06/14 1124 12/06/14 1734 12/06/14 2021 12/06/14 2350 12/07/14 0308 12/07/14 0746  GLUCAP 123* 111* 107* 147* 115* 98    Imaging   EKG: Sinus Tachycardia, inferior q-waves suggestive of prior infarction. 2/28 CXR: RLL atelectasis no sig change   ASSESSMENT / PLAN:  Principal Problem:   Severe sepsis with septic shock Active Problems:   Anasarca   Pelvic mass in female    Hypokalemia   Renal insufficiency   Protein-calorie malnutrition, severe   Bowel perforation   Abscess of gastrointestinal tract   Abdominal mass   Septic shock   Acute respiratory failure with hypoxia   PULMONARY A: Acute Hypoxemic Respiratory failure> no clear infiltrate, failed SBT 2/28, weakness? Given persistent shock will hold off on weaning P:   Full vent support Daily CXR Daily SBT/WUA  CARDIOVASCULAR A: Septic shock, adequately resuscitated, on high dose pressors Abnormal EKG, old Q wave but Echo OK LINES / TUBES: Left [place] CVC - 12/02/2014 > P:   Transfuse 2/29 CVP goal 8-12 Levophed for MAP > 65  RENAL A: AKI, possible CKD-->improved  Hypocalcemia> corrected calcium still low P:   KVO IVF Monitor BMET and UOP Replace electrolytes as needed   GASTROINTESTINAL A: Severe protein calorie malnutrition Abdominal mass Abdominal abscess > s/p drain x2 with purulent drainage P:   Appreciate Dr Dois Davenport assistance here> pt has carcinomatosis, abscesses and perforation. Her ability to tolerate a sgy or to have a good outcome from sgy is extremely compromised Monitor drain output Discussed nutrition with surgery, will stop tubefeeds as now concern for colonic fistula / perf.  HEMATOLOGIC A: Possible colon or GYN malignancy (suspect uterine) Anemia, w/ progressive hgb decline  Thrombocytopenia > repeat value consistent, unclear etiology, likely septic coagulopathy, continues to worsen  She is not / will not be a candidate for surgery and I do not believe she would survive attempts at salvage chemo, etc.  >s/p one unit PRBCs 3/1 >plts cont to drop P:   Would need colonoscopy if peritonitis can be stabilized medically Hold heparin Will hold off on transfusion for now. If family desires continued support will transfuse later today.    INFECTIOUS A: Septic Shock, due to intra-abdominal abscess  CULTURES: Blood - 2/26 - pending Wound 2/27 - multiple  organisms>>> Peritoneal culture 2/27: mod strep group F  P:   ANTIBIOTICS: Pip-tazo: 2/25>>> Vanc: 2/26>>> F/u cultures Drains in place  ENDOCRINE A: No active issues P:   Trend glucose   NEUROLOGIC A: Acute encephalopathy  P: PAD protocol  Daily wake up for now, would d/c this criteria if we convert to full comfort.      Erick Colace ACNP-BC [place] Pager # [phone] OR # [phone] if no answer  Attending Note:  I have examined patient, reviewed labs, studies and notes. I have discussed the case with Jerrye Bushy, and I agree with the data and plans as amended above. She is a 69 y/o woman with VDRF and septic shock due to abdominal abscesses, perforation, persistent  shock, peritoneal metastatic disease.  Prognosis here is grim given inability to tolerate surgery, ineffectiveness of medical therapy alone. Her underlying malignancy complicates both of these strategies. I do not believe she can survive to extubation. We have broached subject with her family and will need to speak further regarding goals for care, possible withdrawal of MV and pressors. Her vent needs, exam and pressor needs are stable 3/1, but I do not see any avenue for improvement.   Independent critical care time is 40 minutes.   Baltazar Apo, MD, PhD 12/07/2014, 11:46 AM [facility] [phone] or if no answer [phone]

## 2014-12-07 DEATH — deceased

## 2014-12-08 ENCOUNTER — Inpatient Hospital Stay (HOSPITAL_COMMUNITY): Payer: Commercial Managed Care - HMO

## 2014-12-08 LAB — CBC
HCT: 25.2 % — ABNORMAL LOW (ref 36.0–46.0)
Hemoglobin: 8.5 g/dL — ABNORMAL LOW (ref 12.0–15.0)
MCH: 28.1 pg (ref 26.0–34.0)
MCHC: 33.7 g/dL (ref 30.0–36.0)
MCV: 83.2 fL (ref 78.0–100.0)
PLATELETS: 10 10*3/uL — AB (ref 150–400)
RBC: 3.03 MIL/uL — AB (ref 3.87–5.11)
RDW: 18 % — AB (ref 11.5–15.5)
WBC: 17.3 10*3/uL — ABNORMAL HIGH (ref 4.0–10.5)

## 2014-12-08 LAB — COMPREHENSIVE METABOLIC PANEL
ALT: 25 U/L (ref 0–35)
ANION GAP: 5 (ref 5–15)
AST: 50 U/L — ABNORMAL HIGH (ref 0–37)
Albumin: 1 g/dL — ABNORMAL LOW (ref 3.5–5.2)
Alkaline Phosphatase: 162 U/L — ABNORMAL HIGH (ref 39–117)
BUN: 37 mg/dL — ABNORMAL HIGH (ref 6–23)
CALCIUM: 6.9 mg/dL — AB (ref 8.4–10.5)
CO2: 23 mmol/L (ref 19–32)
Chloride: 106 mmol/L (ref 96–112)
Creatinine, Ser: 1.01 mg/dL (ref 0.50–1.10)
GFR, EST AFRICAN AMERICAN: 64 mL/min — AB (ref >90)
GFR, EST NON AFRICAN AMERICAN: 55 mL/min — AB (ref >90)
GLUCOSE: 82 mg/dL (ref 70–99)
Potassium: 3.9 mmol/L (ref 3.5–5.1)
Sodium: 134 mmol/L — ABNORMAL LOW (ref 135–145)
Total Bilirubin: 5.2 mg/dL — ABNORMAL HIGH (ref 0.3–1.2)
Total Protein: 4.2 g/dL — ABNORMAL LOW (ref 6.0–8.3)

## 2014-12-08 LAB — GLUCOSE, CAPILLARY
GLUCOSE-CAPILLARY: 73 mg/dL (ref 70–99)
Glucose-Capillary: 122 mg/dL — ABNORMAL HIGH (ref 70–99)
Glucose-Capillary: 80 mg/dL (ref 70–99)
Glucose-Capillary: 121 mg/dL — ABNORMAL HIGH (ref 70–99)
Glucose-Capillary: 66 mg/dL — ABNORMAL LOW (ref 70–99)
Glucose-Capillary: 68 mg/dL — ABNORMAL LOW (ref 70–99)
Glucose-Capillary: 78 mg/dL (ref 70–99)
Glucose-Capillary: 66 mg/dL — ABNORMAL LOW (ref 70–99)
Glucose-Capillary: 98 mg/dL (ref 70–99)

## 2014-12-08 MED ORDER — DEXTROSE 50 % IV SOLN
INTRAVENOUS | Status: AC
  Administered 2014-12-08: 25 mL
  Filled 2014-12-08: qty 50

## 2014-12-08 MED ORDER — SODIUM CHLORIDE 0.9 % IV SOLN
20.0000 ug/h | INTRAVENOUS | Status: DC
  Filled 2014-12-08: qty 50

## 2014-12-08 MED ORDER — SODIUM CHLORIDE 0.9 % IV SOLN
25.0000 ug/h | INTRAVENOUS | Status: DC
  Administered 2014-12-08: 25 ug/h via INTRAVENOUS

## 2014-12-08 NOTE — Progress Notes (Signed)
PULMONARY / CRITICAL CARE MEDICINE HISTORY AND PHYSICAL EXAMINATION   Name: Nicole Cannon MRN: [id number] DOB: [birth year]    ADMISSION DATE:  11/11/2014  PRIMARY SERVICE: PCCM  CHIEF COMPLAINT:  Weakness, abdominal distension  BRIEF PATIENT DESCRIPTION: 69 year old woman with an abdominal mass, malnutrition, and septic shock.  SIGNIFICANT STUDIES:  2/27 CT Ab> Large 16cm mass in lower abdomen and pelvis causing distal obstruction, suspicious for colon carcinoma, consider uterin or ovarian carcinoma, 9cm extraluminal gass in small bowel mesentery, consistent with localise abscess, no peritoneal free air, small liver lesions.  Mild retroperitoneal lymphadenopathy, small right pleural effusion 2/27 TTE> LVEF 60%, grade 1 DD, mitral valve annulus calcified, small pericardial effusion  SIGNIFICANT EVENTS : 2/27 IR guided abscess drainage, required intubation 2/29: long d/w sons. Made her DNR w/ no escalation   SUBJECTIVE:   Remains in shock   VITAL SIGNS: Temp:  [97.9 F (36.6 C)-98.3 F (36.8 C)] 98.3 F (36.8 C) (03/02 0400) Pulse Rate:  [81-102] 84 (03/02 0700) Resp:  [12-22] 15 (03/02 0700) BP: (73-160)/(36-105) 94/55 mmHg (03/02 0700) SpO2:  [90 %-100 %] 100 % (03/02 0700) FiO2 (%):  [30 %-35 %] 35 % (03/02 0419) Weight:  [75.1 kg (165 lb 9.1 oz)] 75.1 kg (165 lb 9.1 oz) (03/02 0419) HEMODYNAMICS: CVP:  [9 mmHg-10 mmHg] 10 mmHg VENTILATOR SETTINGS: Vent Mode:  [-] PRVC FiO2 (%):  [30 %-35 %] 35 % Set Rate:  [14 bmp] 14 bmp Vt Set:  [400 mL] 400 mL PEEP:  [5 cmH20] 5 cmH20 Plateau Pressure:  [16 cmH20-19 cmH20] 18 cmH20 INTAKE / OUTPUT: Intake/Output      03/01 0701 - 03/02 0700 03/02 0701 - 03/03 0700   I.V. (mL/kg) 2316 (30.8)    Blood     Other 100    NG/GT     IV Piggyback 350    Total Intake(mL/kg) 2766 (36.8)    Urine (mL/kg/hr) 2620 (1.5)    Drains 180 (0.1)    Total Output 2800     Net -34            PHYSICAL EXAMINATION: General:  Sedated on vent,  more responsive  HEENT: NCAT, ETT PULM: scattered rhonchi  CV: RRR, no mgr Ab: mildly distended, tender to touch, left drain w/ feculent material and right w/ purulent  Ext: warm, diffuse and massive anasarca  Neuro: A&Ox4, maew  LABS:  CBC  Recent Labs Lab 12/06/14 1620 12/07/14 0300 12/08/14 0400  WBC 6.0 20.3* 17.3*  HGB 7.8* 8.8* 8.5*  HCT 25.4* 26.4* 25.2*  PLT 98* 7* 10*   Coag's  Recent Labs Lab 11/09/2014 0555 12/01/2014 2155 12/05/14 1007  APTT 36 36 40*  INR 1.25 1.34 1.31   BMET  Recent Labs Lab 12/06/14 0600 12/07/14 0300 12/08/14 0400  NA 132* 131* 134*  K 3.8 4.0 3.9  CL 104 102 106  CO2 21 21 23   BUN 44* 42* 37*  CREATININE 1.08 1.09 1.01  GLUCOSE 121* 113* 82   Electrolytes  Recent Labs Lab 11/24/2014 0555  12/06/14 0600 12/07/14 0300 12/08/14 0400  CALCIUM 6.9*  < > 6.5* 6.7* 6.9*  MG 1.7  --   --   --   --   < > = values in this interval not displayed. Sepsis Markers  Recent Labs Lab 11/15/2014 1615 11/22/2014 2141 11/20/2014 2155 12/04/14 1830  LATICACIDVEN 2.85* 2.3*  --  2.1*  PROCALCITON  --   --  23.62  --  ABG  Recent Labs Lab 12/04/14 1504 12/07/14 0352  PHART 7.341* 7.255*  PCO2ART 36.6 44.4  PO2ART 299.0* 165.0*   Liver Enzymes  Recent Labs Lab 12/06/14 1125 12/07/14 0300 12/08/14 0400  AST 31 33 50*  ALT 25 24 25   ALKPHOS 140* 159* 162*  BILITOT 2.6* 4.0* 5.2*  ALBUMIN <1.0* 1.0* 1.0*   Cardiac Enzymes  Recent Labs Lab 11/25/2014 0555 11/17/2014 2155  TROPONINI <0.03 <0.03   Glucose  Recent Labs Lab 12/07/14 0308 12/07/14 0746 12/07/14 1204 12/07/14 1617 12/07/14 2357 12/08/14 0313  GLUCAP 115* 98 105* 105* 122* 80    Imaging   EKG: Sinus Tachycardia, inferior q-waves suggestive of prior infarction. 2/28 CXR: RLL atelectasis no sig change   ASSESSMENT / PLAN:  Principal Problem:   Severe sepsis with septic shock Active Problems:   Anasarca   Pelvic mass in female   Hypokalemia    Renal insufficiency   Protein-calorie malnutrition, severe   Bowel perforation   Abscess of gastrointestinal tract   Abdominal mass   Septic shock   Acute respiratory failure with hypoxia   Intra-abdominal abscess   PULMONARY A: Acute Hypoxemic Respiratory failure> no clear infiltrate, failed SBT 2/28, weakness? Given persistent shock will hold off on weaning P:   Full vent support Daily CXR Daily SBT/WUA  CARDIOVASCULAR A: Septic shock, adequately resuscitated, on high dose pressors Abnormal EKG, old Q wave but Echo OK LINES / TUBES: Left [place] CVC - 12/01/2014 > P:   Transfuse 2/29 CVP goal 8-12 Levophed for MAP > 65  RENAL A: AKI, possible CKD-->improved  Hypocalcemia> corrected calcium still low P:   KVO IVF Monitor BMET and UOP Replace electrolytes as needed   GASTROINTESTINAL A: Severe protein calorie malnutrition Abdominal mass Abdominal abscess > s/p drain x2 with purulent drainage P:   Appreciate Dr Dois Davenport assistance here> pt has carcinomatosis, abscesses and perforation. Her ability to tolerate a sgy or to have a good outcome from sgy is extremely compromised Monitor drain output Discussed nutrition with surgery, stopped tube feeds as now concern for colonic fistula / perf. Will discuss further w/ family today. Do not think she will benefit from TNA  HEMATOLOGIC A: Possible colon or GYN malignancy (suspect uterine) Anemia, w/ progressive hgb decline  Thrombocytopenia > repeat value consistent, unclear etiology, likely septic coagulopathy, continues to worsen  She is not / will not be a candidate for surgery and I do not believe she would survive attempts at salvage chemo, etc.  >s/p one unit PRBCs 3/1 >plts have held overnight  P:   Consider colonoscopy if peritonitis can be stabilized medically > unclear whether this will ultimately change management Hold heparin Will hold off on transfusion for now.    INFECTIOUS A: Septic Shock, due to  intra-abdominal abscess  CULTURES: Blood - 2/26 - pending Wound 2/27 - multiple organisms Peritoneal culture 2/27: mod strep group F  P:   ANTIBIOTICS: Pip-tazo: 2/25>>> Vanc: 2/26>>>3/2 F/u cultures Drains in place Will d/c vanc today  ENDOCRINE A: No active issues P:   Trend glucose   NEUROLOGIC A: Acute encephalopathy  P: PAD protocol  Daily wake up for now   NP SUMMARY No sig change overnight. Still on pressors. A little more awake. Platelets have not dropped for the first time in 48hrs. She will likely get to the point she can wean, however she is not a candidate for surgery-->so likely the best case scenario here is that we make goal to wean to  extubate, then hospice. Will approach family with this option.   Nicole Cannon ACNP-BC [place] Pager # [phone] OR # [phone] if no answer   Attending Note:  I have examined patient, reviewed labs, studies and notes. I have discussed the case with Nicole Cannon, and I agree with the data and plans as amended above. Continues to require pressors and MV, pain control for her intra-abd abscesses and peritonitis. Her underlying insults are unfortunately untreatable - she cannot have sgy and she would not tolerate treatment for her malignancy. If we can stabilize her hemodynamically and if we can decrease narcotics without resulting severe abd pain then we may be able to achieve extubation with a transition to palliation. I am concerned that she will be dyspneic and in pain as fentanyl if reduced. If she cannot tolerate decreased fentanyl or the WOB, then I favor withdrawal of care for comfort. Will review these options with her family. Clearly an extubation that allows her to comfortably interact with her sons, possibly even go home w Hospice, would be preferred.  We will discuss the options with her sons once we see how she tolerates decreased narcs. Independent critical care time is 40 minutes.   Nicole Apo,  MD, PhD 12/08/2014, 9:40 AM [facility] [phone] or if no answer [phone]

## 2014-12-08 NOTE — Progress Notes (Signed)
5cc of 2500 mcg in 250 mL Fentanyl wasted in sink with Nestor Lewandowsky, RN.   Idelle Crouch, RN

## 2014-12-08 NOTE — Progress Notes (Signed)
Hypoglycemic Event  CBG: 68  Treatment: D50 IV 25 mL  Symptoms: None  Follow-up CBG: Time: 0905 CBG Result: 121  Possible Reasons for Event: Unknown  Comments/MD notified: Marni Griffon, NP notified    Grigg,Aradia Estey J  Remember to initiate Hypoglycemia Order Set & complete

## 2014-12-09 DIAGNOSIS — A419 Sepsis, unspecified organism: Secondary | ICD-10-CM

## 2014-12-09 LAB — BASIC METABOLIC PANEL
Anion gap: 5 (ref 5–15)
BUN: 34 mg/dL — AB (ref 6–23)
CHLORIDE: 106 mmol/L (ref 96–112)
CO2: 23 mmol/L (ref 19–32)
Calcium: 6.7 mg/dL — ABNORMAL LOW (ref 8.4–10.5)
Creatinine, Ser: 1.03 mg/dL (ref 0.50–1.10)
GFR calc Af Amer: 62 mL/min — ABNORMAL LOW (ref >90)
GFR calc non Af Amer: 54 mL/min — ABNORMAL LOW (ref >90)
GLUCOSE: 82 mg/dL (ref 70–99)
POTASSIUM: 3 mmol/L — AB (ref 3.5–5.1)
SODIUM: 134 mmol/L — AB (ref 135–145)

## 2014-12-09 LAB — GLUCOSE, CAPILLARY
GLUCOSE-CAPILLARY: 74 mg/dL (ref 70–99)
GLUCOSE-CAPILLARY: 114 mg/dL — AB (ref 70–99)
Glucose-Capillary: 89 mg/dL (ref 70–99)
Glucose-Capillary: 86 mg/dL (ref 70–99)
Glucose-Capillary: 64 mg/dL — ABNORMAL LOW (ref 70–99)
Glucose-Capillary: 87 mg/dL (ref 70–99)
Glucose-Capillary: 92 mg/dL (ref 70–99)

## 2014-12-09 LAB — CBC
HEMATOCRIT: 21.6 % — AB (ref 36.0–46.0)
Hemoglobin: 7.6 g/dL — ABNORMAL LOW (ref 12.0–15.0)
MCH: 28.5 pg (ref 26.0–34.0)
MCHC: 35.2 g/dL (ref 30.0–36.0)
MCV: 80.9 fL (ref 78.0–100.0)
PLATELETS: 18 10*3/uL — AB (ref 150–400)
RBC: 2.67 MIL/uL — ABNORMAL LOW (ref 3.87–5.11)
RDW: 17.7 % — ABNORMAL HIGH (ref 11.5–15.5)
WBC: 16.6 10*3/uL — ABNORMAL HIGH (ref 4.0–10.5)

## 2014-12-09 MED ORDER — DEXTROSE 50 % IV SOLN
INTRAVENOUS | Status: AC
  Administered 2014-12-09: 25 mL
  Filled 2014-12-09: qty 50

## 2014-12-09 MED ORDER — FENTANYL 75 MCG/HR TD PT72
75.0000 ug | MEDICATED_PATCH | TRANSDERMAL | Status: DC
  Administered 2014-12-09: 75 ug via TRANSDERMAL
  Filled 2014-12-09: qty 1

## 2014-12-09 NOTE — Care Management Utilization Note (Signed)
CARE MANAGEMENT NOTE 12/09/2014  Patient:  ASHAYA, RAFTERY   Account Number:  [id number]  Date Initiated:  12/06/2014  Documentation initiated by:  Shaianne Nucci  Subjective/Objective Assessment:   day 4 of vent:Large 16cm mass in lower abdomen and pelvis causing distal obstruction, suspicious for colon carcinoma, consider uterin or ovarian carcinoma, 9cm extraluminal gass in small bowel mesentery, consistent with localise abscess,     Action/Plan:   tbd rmains on full vent syupport   Anticipated DC Date:  12/12/2014   Anticipated DC Plan:    In-house referral  Clinical Social Worker      DC Planning Services  CM consult      [facility] Choice  NA   Choice offered to / List presented to:  NA   DME arranged  NA      DME agency  NA     [place] arranged  NA      [place] agency  NA   Status of service:  In process, will continue to follow Medicare Important Message given?   (If response is "NO", the following Medicare IM given date fields will be blank) Date Medicare IM given:   Medicare IM given by:   Date Additional Medicare IM given:   Additional Medicare IM given by:    Discharge Disposition:    Per UR Regulation:  Reviewed for med. necessity/level of care/duration of stay  If discussed at [place] of Stay Meetings, dates discussed:   12/07/2014  12/09/2014    Comments:  December 09, 2014/Bralin Garry L. Rosana Hoes, RN, BSN, CCM. Case Management [place] [phone] No discharge needs present of time of review. review case with palliative care/family is coming to grips with poor prognosis but not ready to let go of support and hope.  Care slowly being deescalated as patient condition changes due to no escalation of care.    Feb. 29 2016/Kuron Docken L. Rosana Hoes, RN, BSN, CCM. Case Management [place] [phone] No discharge needs present of time of review.

## 2014-12-09 NOTE — Progress Notes (Signed)
PULMONARY / CRITICAL CARE MEDICINE HISTORY AND PHYSICAL EXAMINATION   Name: Hedaya Latendresse MRN: [id number] DOB: Dec 20, 1945    ADMISSION DATE:  11/24/2014  PRIMARY SERVICE: PCCM  CHIEF COMPLAINT:  Weakness, abdominal distension  BRIEF PATIENT DESCRIPTION: 69 year old woman with an abdominal mass, malnutrition, and septic shock.  SIGNIFICANT STUDIES:  2/27 CT Ab> Large 16cm mass in lower abdomen and pelvis causing distal obstruction, suspicious for colon carcinoma, consider uterin or ovarian carcinoma, 9cm extraluminal gass in small bowel mesentery, consistent with localise abscess, no peritoneal free air, small liver lesions.  Mild retroperitoneal lymphadenopathy, small right pleural effusion 2/27 TTE> LVEF 60%, grade 1 DD, mitral valve annulus calcified, small pericardial effusion  SIGNIFICANT EVENTS : 2/27 IR guided abscess drainage, required intubation 2/29: long d/w sons. Made her DNR w/ no escalation   SUBJECTIVE:   Remains in shock   VITAL SIGNS: Temp:  [98 F (36.7 C)-98.6 F (37 C)] 98 F (36.7 C) (03/03 0800) Pulse Rate:  [76-111] 103 (03/03 0600) Resp:  [15-38] 29 (03/03 0600) BP: (59-158)/(42-122) 97/55 mmHg (03/03 0600) SpO2:  [93 %-100 %] 100 % (03/03 0600) FiO2 (%):  [35 %] 35 % (03/03 0836) Weight:  [75.2 kg (165 lb 12.6 oz)-77.3 kg (170 lb 6.7 oz)] 75.2 kg (165 lb 12.6 oz) (03/03 0546) HEMODYNAMICS:   VENTILATOR SETTINGS: Vent Mode:  [-] Other (Comment) FiO2 (%):  [35 %] 35 % Set Rate:  [14 bmp] 14 bmp Vt Set:  [400 mL] 400 mL PEEP:  [5 cmH20] 5 cmH20 Pressure Support:  [5 cmH20-10 cmH20] 5 cmH20 Plateau Pressure:  [15 cmH20-21 cmH20] 17 cmH20 INTAKE / OUTPUT: Intake/Output      03/02 0701 - 03/03 0700 03/03 0701 - 03/04 0700   I.V. (mL/kg) 1061.7 (14.1)    Other 40    NG/GT 30    IV Piggyback 300    Total Intake(mL/kg) 1431.7 (19)    Urine (mL/kg/hr) 1420 (0.8) 150 (1.1)   Emesis/NG output 300 (0.2)    Drains 195 (0.1)    Stool 0 (0)    Total  Output 1915 150   Net -483.3 -150        Stool Occurrence 1 x      PHYSICAL EXAMINATION: General:  Sedated on vent, more responsive  HEENT: NCAT, ETT PULM: scattered rhonchi  CV: RRR, no mgr Ab: mildly distended, tender to touch, left drain w/ feculent material and right w/ purulent  Ext: warm, diffuse and massive anasarca  Neuro: A&Ox4, maew  LABS:  CBC  Recent Labs Lab 12/06/14 1620 12/07/14 0300 12/08/14 0400  WBC 6.0 20.3* 17.3*  HGB 7.8* 8.8* 8.5*  HCT 25.4* 26.4* 25.2*  PLT 98* 7* 10*   Coag's  Recent Labs Lab 11/29/2014 0555 11/30/2014 2155 12/05/14 1007  APTT 36 36 40*  INR 1.25 1.34 1.31   BMET  Recent Labs Lab 12/06/14 0600 12/07/14 0300 12/08/14 0400  NA 132* 131* 134*  K 3.8 4.0 3.9  CL 104 102 106  CO2 21 21 23   BUN 44* 42* 37*  CREATININE 1.08 1.09 1.01  GLUCOSE 121* 113* 82   Electrolytes  Recent Labs Lab 12/04/2014 0555  12/06/14 0600 12/07/14 0300 12/08/14 0400  CALCIUM 6.9*  < > 6.5* 6.7* 6.9*  MG 1.7  --   --   --   --   < > = values in this interval not displayed. Sepsis Markers  Recent Labs Lab 11/16/2014 1615 11/12/2014 2141 11/19/2014 2155 12/04/14 1830  LATICACIDVEN 2.85* 2.3*  --  2.1*  PROCALCITON  --   --  23.62  --    ABG  Recent Labs Lab 12/04/14 1504 12/07/14 0352  PHART 7.341* 7.255*  PCO2ART 36.6 44.4  PO2ART 299.0* 165.0*   Liver Enzymes  Recent Labs Lab 12/06/14 1125 12/07/14 0300 12/08/14 0400  AST 31 33 50*  ALT 25 24 25   ALKPHOS 140* 159* 162*  BILITOT 2.6* 4.0* 5.2*  ALBUMIN <1.0* 1.0* 1.0*   Cardiac Enzymes  Recent Labs Lab 11/11/2014 0555 11/16/2014 2155  TROPONINI <0.03 <0.03   Glucose  Recent Labs Lab 12/08/14 1138 12/08/14 1609 12/08/14 1919 12/08/14 2258 12/09/14 0351 12/09/14 0723  GLUCAP 78 73 66* 98 89 86    Imaging   EKG: Sinus Tachycardia, inferior q-waves suggestive of prior infarction. 2/28 CXR: RLL atelectasis no sig change   ASSESSMENT / PLAN:  Principal  Problem:   Severe sepsis with septic shock Active Problems:   Anasarca   Pelvic mass in female   Hypokalemia   Renal insufficiency   Protein-calorie malnutrition, severe   Bowel perforation   Abscess of gastrointestinal tract   Abdominal mass   Septic shock   Acute respiratory failure with hypoxia   Intra-abdominal abscess   PULMONARY A: Acute Hypoxemic Respiratory failure> no clear infiltrate Does well w/ PSV. Weaning off pressors. Mental status currently the major barrier to extubation   P:   Full vent support w/ continued weaning efforts. Ultimately would like to extubate for success (likely short-lived) with no plan for reintubation Daily CXR Daily SBT/WUA  CARDIOVASCULAR A: Septic shock, adequately resuscitated, pressors weaning  Abnormal EKG, old Q wave but Echo OK LINES / TUBES: Left Sims CVC - 11/27/2014 > P:   Transfused 2/29 CVP goal 8-12 Levophed for MAP > 60, have been unable to wean to off  RENAL A: AKI, possible CKD-->improved  Hypocalcemia> corrected calcium still low P:   KVO IVF Monitor BMET and UOP Replace electrolytes as needed   GASTROINTESTINAL A: Severe protein calorie malnutrition Abdominal mass Abdominal abscess > s/p drain x2 with purulent drainage P:   Appreciate Dr Dois Davenport assistance here> pt has carcinomatosis, abscesses and perforation. Her ability to tolerate a sgy or to have a good outcome from sgy is extremely compromised. Not a candidate at this time.  Monitor drain output Discussed nutrition with surgery, stopped tube feeds given concern for colonic fistula / perf. Will continue discussions w family. Do not think she will benefit from TNA  HEMATOLOGIC A: Possible colon or GYN malignancy (suspect uterine) Anemia, w/ progressive hgb decline  Thrombocytopenia > repeat value consistent, unclear etiology, likely septic coagulopathy, continues to worsen  She is not / will not be a candidate for surgery and I do not believe she  would survive attempts at salvage chemo, etc.  >s/p one unit PRBCs 3/1 >plts have held overnight  P:   unclear whether this will ultimately change management Hold heparin Will hold off on transfusion for now.    INFECTIOUS A: Septic Shock, due to intra-abdominal abscess. Unfortunately source control does not appear to be possible.   CULTURES: Blood - 2/26 - pending Wound 2/27 - multiple organisms Peritoneal culture 2/27: mod strep group F  P:   ANTIBIOTICS: Pip-tazo: 2/25>>> Vanc: 2/26>>>3/2 F/u cultures Drains in place Will d/c vanc 3/2  ENDOCRINE A: No active issues P:   Trend glucose   NEUROLOGIC A: Acute encephalopathy  P: PAD protocol  Daily wake up for now  NP SUMMARY No sig change overnight. Weaning pressors. she is not a candidate for surgery-->so likely the best case scenario here is that we make goal to wean to extubate, then hospice. Discussed this short term goal w/ family. I think that they remain hopeful that she will get better but I do not see this happening. Also even if she is to improve we then have to address nutrition. She is not a candidate for chemo-rx, if not a surgical candidate then I don't see a role for TNA. Really comfort care will be our only option.   Erick Colace ACNP-BC [place] Pager # [phone] OR # [phone] if no answer   Attending Note:  I have examined patient, reviewed labs, studies and notes. I have discussed the case with Jerrye Bushy, and I agree with the data and plans as amended above. Septic shock and VDRF due to perforation, peritonitis, intra-abd malignancy. Do not predict to see any significant change and indeed we have not. On exam she is slightly more awake today after some decrease in fentanyl. This may be at the sacrifice of some of her comfort. Her sons and family are in shock about this illness and are not yet prepared to withdraw for comfort. We are working to reach enough stability to allow  extubation with no plan for reintubation. This would allow her time to interact with her family and would take away the burden of a formal withdrawal of care. If we cannot balance her wakefulness with comfort on lower fentanyl then I will direct her family more towards a formal withdrawal of care.  Independent critical care time is 35 minutes.   Baltazar Apo, MD, PhD 12/09/2014, 10:05 AM [facility] [phone] or if no answer [phone]

## 2014-12-09 NOTE — Progress Notes (Signed)
RN aggressively attempted to wean Fentanyl overnight. Each time gtt was decreased the pt became tachycardic, increased tachypnea, grimacing and squirming in the bed, and biting the ET tube. Pt was not ever able to follow commands or respond appropriately. Attempted to use lower gtt rates and use boluses with no success. Pt remains on Fentanyl gtt at 105mcg. Levo was titrated off overnight. Lolita Lenz, RN

## 2014-12-09 NOTE — Progress Notes (Addendum)
ANTIBIOTIC CONSULT NOTE  Pharmacy Consult for Zosyn/Vancomycin Indication: Intra-abdominal infection/abscess/Sepsis  No Known Allergies  Patient Measurements: Height: 5\' 2"  (157.5 cm) Weight: 165 lb 12.6 oz (75.2 kg) IBW/kg (Calculated) : 50.1 Wt=  Vital Signs: Temp: 98 F (36.7 C) (03/03 0800) Temp Source: Axillary (03/03 0800) BP: 119/57 mmHg (03/03 1000) Pulse Rate: 105 (03/03 1000) Intake/Output from previous day: 03/02 0701 - 03/03 0700 In: 1481.7 [I.V.:1061.7; NG/GT:30; IV Piggyback:350] Out: 1915 [Urine:1420; Emesis/NG output:300; Drains:195] Intake/Output from this shift: Total I/O In: 153.9 [I.V.:113.9; Other:20; NG/GT:20] Out: 340 [Urine:240; Drains:100]  Labs:  Recent Labs  12/07/14 0300 12/08/14 0400 12/09/14 0937  WBC 20.3* 17.3* 16.6*  HGB 8.8* 8.5* 7.6*  PLT 7* 10* 18*  CREATININE 1.09 1.01 1.03   Estimated Creatinine Clearance: 48.2 mL/min (by C-G formula based on Cr of 1.03).  Recent Labs  12/06/14 2100  [place] 18.9     Microbiology: Recent Results (from the past 720 hour(s))  Surgical pcr screen     Status: None   Collection Time: 11/10/2014  5:18 PM  Result Value Ref Range Status   MRSA, PCR NEGATIVE NEGATIVE Final   Staphylococcus aureus NEGATIVE NEGATIVE Final    Comment:        The Xpert SA Assay (FDA approved for NASAL specimens in patients over 80 years of age), is one component of a comprehensive surveillance program.  Test performance has been validated by [facility] for patients greater than or equal to 81 year old. It is not intended to diagnose infection nor to guide or monitor treatment.   MRSA PCR Screening     Status: None   Collection Time: 11/27/2014  8:39 PM  Result Value Ref Range Status   MRSA by PCR NEGATIVE NEGATIVE Final    Comment:        The GeneXpert MRSA Assay (FDA approved for NASAL specimens only), is one component of a comprehensive MRSA colonization surveillance program. It is not intended  to diagnose MRSA infection nor to guide or monitor treatment for MRSA infections.   Culture, blood (x 2)     Status: None (Preliminary result)   Collection Time: 11/12/2014  9:55 PM  Result Value Ref Range Status   Specimen Description BLOOD LEFT CENTRAL LINE  Final   Special Requests   Final    BOTTLES DRAWN AEROBIC AND ANAEROBIC 10CC BOTH BOTTLES   Culture   Final           BLOOD CULTURE RECEIVED NO GROWTH TO DATE CULTURE WILL BE HELD FOR 5 DAYS BEFORE ISSUING A FINAL NEGATIVE REPORT Performed at [facility]    Report Status PENDING  Incomplete  Culture, routine-abscess     Status: None   Collection Time: 12/04/14  4:50 PM  Result Value Ref Range Status   Specimen Description PERITONEAL CAVITY  Final   Special Requests NONE  Final   Gram Stain   Final    ABUNDANT WBC PRESENT, PREDOMINANTLY PMN ABUNDANT GRAM NEGATIVE RODS MODERATE GRAM POSITIVE RODS FEW GRAM POSITIVE COCCI IN PAIRS    Culture   Final    MULTIPLE ORGANISMS PRESENT, NONE PREDOMINANT Note: NO STAPHYLOCOCCUS AUREUS ISOLATED NO GROUP A STREP (S.PYOGENES) ISOLATED Performed at [facility]    Report Status 12/07/2014 FINAL  Final  Body fluid culture     Status: None   Collection Time: 12/04/14  4:51 PM  Result Value Ref Range Status   Specimen Description PERITONEAL CAVITY  Final   Special Requests NONE  Final   Gram Stain   Final    ABUNDANT WBC PRESENT, PREDOMINANTLY PMN ABUNDANT GRAM NEGATIVE RODS MODERATE GRAM POSITIVE RODS FEW GRAM POSITIVE COCCI IN PAIRS Gram Stain Report Called to,Read Back By and Verified With: Gram Stain Report Called to,Read Back By and Verified With: AMY ARNOLD RN 12/05/14 AT 11 AM BY [facility]    Culture   Final    MODERATE STREPTOCOCCUS GROUP F Performed at [facility]    Report Status 12/07/2014 FINAL  Final   Organism ID, Bacteria STREPTOCOCCUS GROUP F  Final      Susceptibility   Streptococcus group f - MIC (ETEST)*    PENICILLIN .094 SENSITIVE  Sensitive     * MODERATE STREPTOCOCCUS GROUP F    Medical History: Past Medical History  Diagnosis Date  . Irritable bowel      Assessment: 69 yoF admitted 2/25 with c/o generalized weakness while at work. Zosyn per Rx for ?possible intra-abdominal infection/abcess.  CT scan also demonstrates ?pelvic mass, ?pelvic carcinomatosis - admitted for workup. Pt decompensated on 2/26 and was transferred to the ICU. Pharmacy is asked to add vancomycin.  Perc drain of intra-abdominal fluid collection in IR on 2/27, pt became unresponsive during 1st attempt, procedure aborted. Notes state possibility of aspirating vomit- already on Zosyn for possible aspiration. Drain placement attempted later while pt intubated and was successful.  2/25 >> zosyn  >> 2/26 >> vancomycin >>  Tmax: afeb since admission WBCs: elevated but trending down Renal: SCr improved to wnl; CrCl 46 Lactic acid: 2.85 (2/25)  Microbiology: 2/26 MRSA PCR: negative 2/26 blood x2: NGTD (collected after Zosyn start) 2/27 peritoneal cavity abscess: abundant GNR, some GPRs and GPCs. None predominant - no staph aureus or GAS isolated 2/27 peritoneal fluid: Group F strep (S PCN)  Drug level / dose changes info: 2/29 2100 VT = 18.9 on 1g q24h   Goal of Therapy:   Eradication of infection  Appropriate antibiotic dosing for indication and renal function   Plan:  Day 8 Zosyn for intra-abdominal abscesses and possible aspiration pneumonia  Continue Zosyn 3.375 Gm IV q8h  F/u SCr/cultures/levels as needed, LOT  As stabilizing from an ID standpoint, could consider narrowing to Augmentin or similar as appropriate, based on goals of care   Reuel Boom, PharmD Pager: [phone] 12/09/2014, 11:17 AM

## 2014-12-10 DIAGNOSIS — N838 Other noninflammatory disorders of ovary, fallopian tube and broad ligament: Secondary | ICD-10-CM | POA: Insufficient documentation

## 2014-12-10 LAB — GLUCOSE, CAPILLARY
GLUCOSE-CAPILLARY: 78 mg/dL (ref 70–99)
GLUCOSE-CAPILLARY: 79 mg/dL (ref 70–99)
GLUCOSE-CAPILLARY: 75 mg/dL (ref 70–99)
GLUCOSE-CAPILLARY: 79 mg/dL (ref 70–99)
Glucose-Capillary: 78 mg/dL (ref 70–99)

## 2014-12-10 LAB — BASIC METABOLIC PANEL
ANION GAP: 9 (ref 5–15)
BUN: 46 mg/dL — ABNORMAL HIGH (ref 6–23)
CO2: 21 mmol/L (ref 19–32)
Calcium: 7.1 mg/dL — ABNORMAL LOW (ref 8.4–10.5)
Chloride: 112 mmol/L (ref 96–112)
Creatinine, Ser: 1.38 mg/dL — ABNORMAL HIGH (ref 0.50–1.10)
GFR, EST AFRICAN AMERICAN: 44 mL/min — AB (ref >90)
GFR, EST NON AFRICAN AMERICAN: 38 mL/min — AB (ref >90)
Glucose, Bld: 89 mg/dL (ref 70–99)
Potassium: 4.1 mmol/L (ref 3.5–5.1)
SODIUM: 142 mmol/L (ref 135–145)

## 2014-12-10 LAB — CULTURE, BLOOD (ROUTINE X 2): CULTURE: NO GROWTH

## 2014-12-10 LAB — CBC
HCT: 21.4 % — ABNORMAL LOW (ref 36.0–46.0)
HEMOGLOBIN: 7.5 g/dL — AB (ref 12.0–15.0)
MCH: 28.1 pg (ref 26.0–34.0)
MCHC: 35 g/dL (ref 30.0–36.0)
MCV: 80.1 fL (ref 78.0–100.0)
Platelets: 20 10*3/uL — CL (ref 150–400)
RBC: 2.67 MIL/uL — ABNORMAL LOW (ref 3.87–5.11)
RDW: 18.1 % — ABNORMAL HIGH (ref 11.5–15.5)
WBC: 20.6 10*3/uL — ABNORMAL HIGH (ref 4.0–10.5)

## 2014-12-10 LAB — COMPREHENSIVE METABOLIC PANEL
ALT: 40 U/L — ABNORMAL HIGH (ref 0–35)
ANION GAP: 9 (ref 5–15)
AST: 92 U/L — AB (ref 0–37)
Alkaline Phosphatase: 114 U/L (ref 39–117)
BUN: 38 mg/dL — ABNORMAL HIGH (ref 6–23)
CHLORIDE: 107 mmol/L (ref 96–112)
CO2: 22 mmol/L (ref 19–32)
Calcium: 6.9 mg/dL — ABNORMAL LOW (ref 8.4–10.5)
Creatinine, Ser: 1.12 mg/dL — ABNORMAL HIGH (ref 0.50–1.10)
GFR calc non Af Amer: 49 mL/min — ABNORMAL LOW (ref >90)
GFR, EST AFRICAN AMERICAN: 56 mL/min — AB (ref >90)
Glucose, Bld: 89 mg/dL (ref 70–99)
POTASSIUM: 2.9 mmol/L — AB (ref 3.5–5.1)
Sodium: 138 mmol/L (ref 135–145)
Total Bilirubin: 6.4 mg/dL — ABNORMAL HIGH (ref 0.3–1.2)
Total Protein: 4 g/dL — ABNORMAL LOW (ref 6.0–8.3)

## 2014-12-10 MED ORDER — POTASSIUM CHLORIDE 10 MEQ/50ML IV SOLN
10.0000 meq | INTRAVENOUS | Status: AC
  Administered 2014-12-10 (×8): 10 meq via INTRAVENOUS
  Filled 2014-12-10 (×9): qty 50

## 2014-12-10 MED ORDER — FUROSEMIDE 10 MG/ML IJ SOLN
40.0000 mg | Freq: Once | INTRAMUSCULAR | Status: AC
  Administered 2014-12-10: 40 mg via INTRAVENOUS
  Filled 2014-12-10: qty 4

## 2014-12-10 NOTE — Progress Notes (Signed)
[facility] ADULT ICU REPLACEMENT PROTOCOL FOR AM LAB REPLACEMENT ONLY  The patient does apply for the [facility] Adult ICU Electrolyte Replacment Protocol based on the criteria listed below:   1. Is GFR >/= 40 ml/min? Yes.    Patient's GFR today is 49 2. Is urine output >/= 0.5 ml/kg/hr for the last 6 hours? Yes.   Patient's UOP is 0.5 ml/kg/hr 3. Is BUN < 60 mg/dL? Yes.    Patient's BUN today is 38 4. Abnormal electrolyte(s): K 2.9 5. Ordered repletion with: [facility] adult ICU replacement protocol 6. If a panic level lab has been reported, has the CCM MD in charge been notified? Yes.  .   Physician:  Dr. Brand Males  [facility], Darrick Huntsman E 12/10/2014 6:32 AM

## 2014-12-10 NOTE — Progress Notes (Signed)
Fentanyl from IV bag wasted in sink 127mL. Witnessed by Duard Larsen, RN.

## 2014-12-10 NOTE — Progress Notes (Signed)
NUTRITION FOLLOW-UP  INTERVENTION:  - If nutrition support warranted, please consult dietitian.  - Diet advancement per MD.  - RD to add supplements once diet advanced.   RD will continue to monitor.  NUTRITION DIAGNOSIS: Inadequate oral intake related to poor appetite as evidenced by abdominal pain for 2-3 months; ongoing  Goal: Pt to meet >/= 90% of estimated needs; not met  Monitor:  PO intake when diet advanced, weight trends, labs  ASSESSMENT: Pt presents with generalized weakness and swelling in her legs for the past 2 weeks.  Noted to be hypokalemic and have mild renal insufficiency.  CT scan showed pelvic mass ? Pelvic carcinomatosis?   Pt hx of IBS.     Patient is currently intubated on ventilator support MV: 7.5 L/min Temp (24hrs), Avg:98.3 F (36.8 C), Min:97.6 F (36.4 C), Max:98.7 F (37.1 C)  Propofol: none  Per RN, pt with no bowel sounds.  TF held due to concern for colonic fistula/perforation.  Pt made DNR. Per RN, focus is no escalation of care. Per MD note- would like to extubate for success (likely short-lived) with no plan for re-intubation.  RD will continue to monitor Labs reviewed  Pt meets criteria for severe MALNUTRITION in the context of chronic illness as evidenced by less than >75% of PO intake in the past month, moderate to severe fat and muscle wasting and severe fluid accumulation.   Height: Ht Readings from Last 1 Encounters:  11/19/2014 _0  (1.575 m)    Weight: Wt Readings from Last 1 Encounters:  12/09/14 165 lb 12.6 oz (75.2 kg)   BMI:  Body mass index is 30.31 kg/(m^2).  Estimated Nutritional Needs: Kcal: 1374 Protein: 105-115 g  Fluid: >/= 1.5 L/day  Skin: WDL  Diet Order: Diet NPO time specified  EDUCATION NEEDS: -No education needs identified at this time   Intake/Output Summary (Last 24 hours) at 12/10/14 0954 Last data filed at 12/10/14 0911  Gross per 24 hour  Intake    831 ml  Output    970 ml  Net    -139 ml    Last BM: 3/4    Labs:   Recent Labs Lab 12/08/14 0400 12/09/14 0937 12/10/14 0525  NA 134* 134* 138  K 3.9 3.0* 2.9*  CL 106 106 107  CO2 _1 BUN 37* 34* 38*  CREATININE 1.01 1.03 1.12*  CALCIUM 6.9* 6.7* 6.9*  GLUCOSE 82 82 89    CBG (last 3)   Recent Labs  12/09/14 2320 12/10/14 0404 12/10/14 0744  GLUCAP 92 78 79    Scheduled Meds: . sodium chloride   Intravenous Once  . antiseptic oral rinse  7 mL Mouth Rinse QID  . calcium gluconate  1 g Intravenous Once  . chlorhexidine  15 mL Mouth Rinse BID  . fentaNYL  75 mcg Transdermal Q72H  . furosemide  40 mg Intravenous Once  . insulin aspart  0-9 Units Subcutaneous 6 times per day  . pantoprazole (PROTONIX) IV  40 mg Intravenous QHS  . piperacillin-tazobactam  3.375 g Intravenous 3 times per day  . potassium chloride  10 mEq Intravenous Q1H  . sodium chloride  3 mL Intravenous Q12H    Continuous Infusions: . sodium chloride 30 mL/hr at 12/10/14 0800  . fentaNYL infusion INTRAVENOUS Stopped (12/09/14 0836)    Laurette Schimke Graham, Joseph[place], [place]

## 2014-12-10 NOTE — Progress Notes (Signed)
PULMONARY / CRITICAL CARE MEDICINE HISTORY AND PHYSICAL EXAMINATION   Name: Tianni Escamilla MRN: [id number] DOB: 04-20-1946    ADMISSION DATE:  12/01/2014  PRIMARY SERVICE: PCCM  CHIEF COMPLAINT:  Weakness, abdominal distension  BRIEF PATIENT DESCRIPTION: 69 year old woman with an abdominal mass, malnutrition, and septic shock.  SIGNIFICANT STUDIES:  2/27 CT Ab> Large 16cm mass in lower abdomen and pelvis causing distal obstruction, suspicious for colon carcinoma, consider uterin or ovarian carcinoma, 9cm extraluminal gass in small bowel mesentery, consistent with localise abscess, no peritoneal free air, small liver lesions.  Mild retroperitoneal lymphadenopathy, small right pleural effusion 2/27 TTE> LVEF 60%, grade 1 DD, mitral valve annulus calcified, small pericardial effusion  SIGNIFICANT EVENTS : 2/27 IR guided abscess drainage, required intubation 2/29: long d/w sons. Made her DNR w/ no escalation 3/3 off pressors 3/4 weaning but not awake.   SUBJECTIVE:   Off pressors  VITAL SIGNS: Temp:  [97.6 F (36.4 C)-98.7 F (37.1 C)] 97.8 F (36.6 C) (03/04 0406) Pulse Rate:  [74-109] 93 (03/04 0800) Resp:  [20-41] 29 (03/04 0800) BP: (88-130)/(35-73) 96/55 mmHg (03/04 0800) SpO2:  [100 %] 100 % (03/04 0800) FiO2 (%):  [35 %] 35 % (03/04 0335) HEMODYNAMICS:   VENTILATOR SETTINGS: Vent Mode:  [-] PRVC FiO2 (%):  [35 %] 35 % Set Rate:  [14 bmp] 14 bmp Vt Set:  [400 mL] 400 mL PEEP:  [5 cmH20] 5 cmH20 Pressure Support:  [10 cmH20] 10 cmH20 Plateau Pressure:  [16 cmH20-20 cmH20] 16 cmH20 INTAKE / OUTPUT: Intake/Output      03/03 0701 - 03/04 0700 03/04 0701 - 03/05 0700   I.V. (mL/kg) 623.9 (8.3)    Other 60    NG/GT 40    IV Piggyback 100    Total Intake(mL/kg) 823.9 (11)    Urine (mL/kg/hr) 795 (0.4)    Emesis/NG output 100 (0.1)    Drains 280 (0.2)    Stool 0 (0)    Total Output 1175     Net -351.1          Stool Occurrence 2 x      PHYSICAL  EXAMINATION: General:  Sedated on vent, slow to respond  HEENT: NCAT, ETT PULM: scattered rhonchi  CV: RRR, no mgr Ab: mildly distended, tender to touch, left drain w/ feculent material and right w/ purulent  Ext: warm, diffuse and massive anasarca  Neuro: A&Ox4, maew  LABS:  CBC  Recent Labs Lab 12/08/14 0400 12/09/14 0937 12/10/14 0525  WBC 17.3* 16.6* 20.6*  HGB 8.5* 7.6* 7.5*  HCT 25.2* 21.6* 21.4*  PLT 10* 18* 20*   Coag's  Recent Labs Lab 11/28/2014 2155 12/05/14 1007  APTT 36 40*  INR 1.34 1.31   BMET  Recent Labs Lab 12/08/14 0400 12/09/14 0937 12/10/14 0525  NA 134* 134* 138  K 3.9 3.0* 2.9*  CL 106 106 107  CO2 23 23 22   BUN 37* 34* 38*  CREATININE 1.01 1.03 1.12*  GLUCOSE 82 82 89   Electrolytes  Recent Labs Lab 12/08/14 0400 12/09/14 0937 12/10/14 0525  CALCIUM 6.9* 6.7* 6.9*   Sepsis Markers  Recent Labs Lab 11/23/2014 2141 12/02/2014 2155 12/04/14 1830  LATICACIDVEN 2.3*  --  2.1*  PROCALCITON  --  23.62  --    ABG  Recent Labs Lab 12/04/14 1504 12/07/14 0352  PHART 7.341* 7.255*  PCO2ART 36.6 44.4  PO2ART 299.0* 165.0*   Liver Enzymes  Recent Labs Lab 12/07/14 0300 12/08/14 0400 12/10/14 0525  AST 33 50* 92*  ALT 24 25 40*  ALKPHOS 159* 162* 114  BILITOT 4.0* 5.2* 6.4*  ALBUMIN 1.0* 1.0* <1.0*   Cardiac Enzymes  Recent Labs Lab 11/25/2014 2155  TROPONINI <0.03   Glucose  Recent Labs Lab 12/09/14 1603 12/09/14 1627 12/09/14 2011 12/09/14 2320 12/10/14 0404 12/10/14 0744  GLUCAP 64* 114* 87 92 78 79    Imaging   EKG: Sinus Tachycardia, inferior q-waves suggestive of prior infarction. 2/28 CXR: RLL atelectasis no sig change   ASSESSMENT / PLAN:  Principal Problem:   Severe sepsis with septic shock Active Problems:   Anasarca   Pelvic mass in female   Hypokalemia   Renal insufficiency   Protein-calorie malnutrition, severe   Bowel perforation   Abscess of gastrointestinal tract    Abdominal mass   Septic shock   Acute respiratory failure with hypoxia   Intra-abdominal abscess   PULMONARY A: Acute Hypoxemic Respiratory failure> no clear infiltrate Does well w/ PSV. Weaning off pressors. Mental status currently the major barrier to extubation   P:   Full vent support w/ continued weaning efforts. Ultimately would like to extubate for success (likely short-lived) with no plan for reintubation. Her sons are not at a point where they could deal with burden of formal withdrawal.  Daily CXR Daily SBT/WUA Will give a little lasix 3/4 to see if this will facilitate weaning   CARDIOVASCULAR A: Septic shock, adequately resuscitated, pressors weaning -->off 3/3 Abnormal EKG, old Q wave but Echo OK LINES / TUBES: Left [place] CVC - 11/20/2014 > P:   Levophed off -->will not restart Accept SBP >85   RENAL A: AKI, possible CKD-->improved  Hypocalcemia> corrected calcium still low Hypokalemia  P:   KVO IVF Monitor BMET and UOP Replace electrolytes as needed   GASTROINTESTINAL A: Severe protein calorie malnutrition Abdominal mass Abdominal abscess > s/p drain x2 with purulent drainage P:   CCS has evaluated > pt has carcinomatosis, abscesses and perforation. Her ability to tolerate a sgy or to have a good outcome from sgy is extremely compromised. Not a candidate for procedures Discussed nutrition with surgery, stopped tube feeds given concern for colonic fistula / perf. Will continue discussions w family. Do not think she will benefit from TNA  HEMATOLOGIC A: Possible colon or GYN malignancy (suspect uterine) Anemia, w/ progressive hgb decline  Thrombocytopenia > repeat value consistent, unclear etiology, likely septic coagulopathy, continues to worsen  She is not / will not be a candidate for surgery and I do not believe she would survive attempts at salvage chemo, etc.  >s/p one unit PRBCs 3/1 >plts have held overnight  P:   Hold heparin Not a candidate  for transfusions   INFECTIOUS A: Septic Shock, due to intra-abdominal abscess. Unfortunately source control does not appear to be possible.   CULTURES: Blood - 2/26 - pending Wound 2/27 - multiple organisms Peritoneal culture 2/27: mod strep group F  P:   ANTIBIOTICS: Pip-tazo: 2/25>>> Vanc: 2/26>>>3/2 F/u cultures Drains in place  Continue abx for now - endpoint not clear at this point  ENDOCRINE A: No active issues P:   Trend glucose   NEUROLOGIC A: Acute encephalopathy  P: Dc fentanyl gtt PRN fent fent patch    NP SUMMARY No sig change overnight. Weaning pressors. she is not a candidate for surgery-->so likely the best case scenario here is that we make goal to wean to extubate, then hospice. Discussed this short term goal w/ family. Hope to  extubate either today or tomorrow w/ no plan to re-intubate. Off levophed. Will dc this and not resume. Will give low dose lasix to see if we can mobilize fluid and facilitate weaning efforts. Will de-escalate lab draws.   Erick Colace ACNP-BC [place] Pager # [phone] OR # [phone] if no answer   Attending Note:  I have examined patient, reviewed labs, studies and notes. I have discussed the case with Jerrye Bushy, and I agree with the data and plans as amended above. She is overall stable, norepi has been weaned to off. No change in vent needs. Sedation has been held in hopes of a comfortable effort at SBT and then extubation, hospice care. This may not be achievable - will not sacrifice her comfort to allow an awake extubation. Mr Kary Kos and I both have explained this to her family. We will make efforts to minimize invasive treatments, labs, etc. Independent critical care time is 35 minutes.   Baltazar Apo, MD, PhD 12/10/2014, 9:53 AM [facility] [phone] or if no answer [phone]

## 2014-12-10 NOTE — Plan of Care (Signed)
Problem: Phase I Progression Outcomes Goal: Pain controlled with appropriate interventions Outcome: Completed/Met Date Met:  12/10/14 Pt does not appear to be experiencing pain.

## 2014-12-11 LAB — BASIC METABOLIC PANEL
ANION GAP: 12 (ref 5–15)
BUN: 51 mg/dL — ABNORMAL HIGH (ref 6–23)
CO2: 20 mmol/L (ref 19–32)
Calcium: 7.1 mg/dL — ABNORMAL LOW (ref 8.4–10.5)
Chloride: 109 mmol/L (ref 96–112)
Creatinine, Ser: 1.69 mg/dL — ABNORMAL HIGH (ref 0.50–1.10)
GFR calc Af Amer: 34 mL/min — ABNORMAL LOW (ref >90)
GFR, EST NON AFRICAN AMERICAN: 30 mL/min — AB (ref >90)
GLUCOSE: 104 mg/dL — AB (ref 70–99)
POTASSIUM: 4.2 mmol/L (ref 3.5–5.1)
Sodium: 141 mmol/L (ref 135–145)

## 2014-12-11 LAB — PHOSPHORUS: PHOSPHORUS: 6.7 mg/dL — AB (ref 2.3–4.6)

## 2014-12-11 LAB — GLUCOSE, CAPILLARY
Glucose-Capillary: 84 mg/dL (ref 70–99)
Glucose-Capillary: 89 mg/dL (ref 70–99)

## 2014-12-11 LAB — MAGNESIUM: Magnesium: 1.9 mg/dL (ref 1.5–2.5)

## 2014-12-11 MED ORDER — MIDAZOLAM HCL 5 MG/ML IJ SOLN
10.0000 mg/h | INTRAMUSCULAR | Status: DC
  Filled 2014-12-11: qty 10

## 2014-12-11 MED ORDER — MIDAZOLAM BOLUS VIA INFUSION
5.0000 mg | INTRAVENOUS | Status: DC | PRN
  Filled 2014-12-11: qty 20

## 2014-12-11 MED ORDER — NOREPINEPHRINE BITARTRATE 1 MG/ML IV SOLN
0.0000 ug/min | INTRAVENOUS | Status: DC
  Administered 2014-12-11: 7 ug/min via INTRAVENOUS
  Administered 2014-12-11: 2 ug/min via INTRAVENOUS
  Filled 2014-12-11 (×3): qty 4

## 2014-12-11 MED ORDER — SODIUM CHLORIDE 0.9 % IV SOLN
1.0000 mg/h | INTRAVENOUS | Status: DC
  Administered 2014-12-11: 2 mg/h via INTRAVENOUS
  Filled 2014-12-11: qty 10

## 2014-12-11 NOTE — Progress Notes (Signed)
Patient with no spontaneous respirations, no pulse, and no heart beat as ausculated per Jacklynn Lewis RN, abnd Anayiah Howden Cheryln Manly.  Family at bedside and aware of patient expiration.  Masud Holub Roselie Awkward RN

## 2014-12-11 NOTE — Progress Notes (Signed)
PULMONARY / CRITICAL CARE MEDICINE HISTORY AND PHYSICAL EXAMINATION   Name: Nicole Cannon MRN: [id number] DOB: [birth year]    ADMISSION DATE:  11/18/2014  PRIMARY SERVICE: PCCM  CHIEF COMPLAINT:  Weakness, abdominal distension  BRIEF PATIENT DESCRIPTION: 69 year old woman with an abdominal mass, malnutrition, and septic shock.  SIGNIFICANT STUDIES:  2/27 CT Ab> Large 16cm mass in lower abdomen and pelvis causing distal obstruction, suspicious for colon carcinoma, consider uterin or ovarian carcinoma, 9cm extraluminal gass in small bowel mesentery, consistent with localise abscess, no peritoneal free air, small liver lesions.  Mild retroperitoneal lymphadenopathy, small right pleural effusion 2/27 TTE> LVEF 60%, grade 1 DD, mitral valve annulus calcified, small pericardial effusion  SIGNIFICANT EVENTS : 2/27 IR guided abscess drainage, required intubation 2/29: long d/w sons. Made her DNR w/ no escalation 3/3 off pressors 3/4 weaning but not awake.   SUBJECTIVE:   Significant decline overnight, increased pressor needs  VITAL SIGNS: Temp:  [97.5 F (36.4 C)-99 F (37.2 C)] 97.8 F (36.6 C) (03/05 0326) Pulse Rate:  [85-108] 95 (03/05 1000) Resp:  [22-31] 25 (03/05 1000) BP: (62-122)/(28-90) 90/46 mmHg (03/05 1000) SpO2:  [99 %-100 %] 100 % (03/05 1000) FiO2 (%):  [30 %-35 %] 30 % (03/05 0830) HEMODYNAMICS:   VENTILATOR SETTINGS: Vent Mode:  [-] PRVC FiO2 (%):  [30 %-35 %] 30 % Set Rate:  [14 bmp] 14 bmp Vt Set:  [400 mL] 400 mL PEEP:  [5 cmH20] 5 cmH20 Pressure Support:  [5 cmH20] 5 cmH20 Plateau Pressure:  [19 cmH20-23 cmH20] 23 cmH20 INTAKE / OUTPUT: Intake/Output      03/04 0701 - 03/05 0700 03/05 0701 - 03/06 0700   I.V. (mL/kg) 630.5 (8.4) 112.6 (1.5)   Other 10    NG/GT 30    IV Piggyback 500    Total Intake(mL/kg) 1170.5 (15.6) 112.6 (1.5)   Urine (mL/kg/hr) 300 (0.2)    Emesis/NG output 200 (0.1)    Drains 40 (0)    Stool     Total Output 540     Net  +630.5 +112.6          PHYSICAL EXAMINATION: General:  Sedated on vent, slow to respond  HEENT: NCAT, ETT PULM: scattered rhonchi  CV: RRR, no mgr Ab: mildly distended, tender to touch, left drain w/ feculent material and right w/ purulent  Ext: warm, diffuse and massive anasarca  Neuro: A&Ox4, maew  LABS:  CBC  Recent Labs Lab 12/08/14 0400 12/09/14 0937 12/10/14 0525  WBC 17.3* 16.6* 20.6*  HGB 8.5* 7.6* 7.5*  HCT 25.2* 21.6* 21.4*  PLT 10* 18* 20*   Coag's  Recent Labs Lab 12/05/14 1007  APTT 40*  INR 1.31   BMET  Recent Labs Lab 12/10/14 0525 12/10/14 2030 12/10/2014 0520  NA 138 142 141  K 2.9* 4.1 4.2  CL 107 112 109  CO2 22 21 20   BUN 38* 46* 51*  CREATININE 1.12* 1.38* 1.69*  GLUCOSE 89 89 104*   Electrolytes  Recent Labs Lab 12/10/14 0525 12/10/14 2030 12/21/2014 0520  CALCIUM 6.9* 7.1* 7.1*  MG  --   --  1.9  PHOS  --   --  6.7*   Sepsis Markers  Recent Labs Lab 12/04/14 1830  LATICACIDVEN 2.1*   ABG  Recent Labs Lab 12/04/14 1504 12/07/14 0352  PHART 7.341* 7.255*  PCO2ART 36.6 44.4  PO2ART 299.0* 165.0*   Liver Enzymes  Recent Labs Lab 12/07/14 0300 12/08/14 0400 12/10/14 0525  AST 33  50* 92*  ALT 24 25 40*  ALKPHOS 159* 162* 114  BILITOT 4.0* 5.2* 6.4*  ALBUMIN 1.0* 1.0* <1.0*   Cardiac Enzymes No results for input(s): TROPONINI, PROBNP in the last 168 hours. Glucose  Recent Labs Lab 12/10/14 0744 12/10/14 1119 12/10/14 1708 12/10/14 1931 12/26/2014 0001 01/03/2015 0324  GLUCAP 79 75 79 78 84 89    Imaging   EKG: Sinus Tachycardia, inferior q-waves suggestive of prior infarction. 2/28 CXR: RLL atelectasis no sig change   ASSESSMENT / PLAN:  Principal Problem:   Severe sepsis with septic shock Active Problems:   Anasarca   Pelvic mass in female   Hypokalemia   Renal insufficiency   Protein-calorie malnutrition, severe   Bowel perforation   Abscess of gastrointestinal tract   Abdominal  mass   Septic shock   Acute respiratory failure with hypoxia   Intra-abdominal abscess   Ovarian mass   PULMONARY A: Acute Hypoxemic Respiratory failure> no clear infiltrate  P:   Full vent support for now but given her decline last 24 h and our inability to achieve an improvement in MS that would allow an extubation for success we have now undertaken discussions of a formal withdrawal of care. I have discussed with [name] and other family at bedside this am as below.   CARDIOVASCULAR A: Septic shock, adequately resuscitated, pressors weaning -->off 3/3 Abnormal EKG, old Q wave but Echo OK LINES / TUBES: Left [place] CVC - 11/16/2014 > P:   Levophed was off, plan had been to not restart but support was added back overnight when she declined as [name] was not ready to let her go. We agreed today that we would not increase the pressors any further, that we would accept dropping BP as we titrate up narcotics. If we get to the point of withdrawal of care then they will be Ok with stopping the pressors. I will focus first on extubation  RENAL A: AKI, possible CKD-->improved  Hypocalcemia> corrected calcium still low Hypokalemia  P:   KVO IVF Will stop checking labs and CBG   GASTROINTESTINAL A: Severe protein calorie malnutrition Abdominal mass Abdominal abscess > s/p drain x2 with purulent drainage P:   CCS has evaluated > pt has carcinomatosis, abscesses and perforation. Her ability to tolerate a sgy or to have a good outcome from sgy is extremely compromised. Not a candidate for procedures Discussed nutrition with surgery, stopped tube feeds given concern for colonic fistula / perf.  HEMATOLOGIC A: Possible colon or GYN malignancy (suspect uterine) Anemia, w/ progressive hgb decline  Thrombocytopenia > repeat value consistent, unclear etiology, likely septic coagulopathy, continues to worsen  She is not / will not be a candidate for surgery and I do not believe she would  survive attempts at salvage chemo, etc.  >s/p one unit PRBCs 3/1 P:   Hold heparin Not a candidate for transfusions   INFECTIOUS A: Septic Shock, due to intra-abdominal abscess. Unfortunately source control does not appear to be possible.   CULTURES: Blood - 2/26 - pending Wound 2/27 - multiple organisms Peritoneal culture 2/27: mod strep group F  P:   ANTIBIOTICS: Pip-tazo: 2/25>>> Vanc: 2/26>>>3/2 F/u cultures Drains in place  Will d/c abx today 3/5  ENDOCRINE A: No active issues P:   Stop CBG  NEUROLOGIC A: Acute encephalopathy  P: fent patch  Add morphine gtt this am   SUMMARY More unstable overnight, norepi restarted. She is not a candidate for surgery. I had hoped  for the last several days that she might be able to be extubated and transition to hospice, but clearly this is not going to be possible.  I explained this to family today, have discussed a more formal withdrawal of care and MV.  While very difficult, [name] is in agreement that this is appropriate. I am going to start morphine, await feedback from family regarding timing, people that need to be present. They will let Amy, RN know when / if they are ready.   CC time 45 minutes  Baltazar Apo, MD, PhD 12/18/2014, 11:21 AM [facility] [phone] or if no answer [phone]

## 2014-12-11 NOTE — Progress Notes (Signed)
235cc of 250cc bag of Morphine drip (1 mg/7ml concentration) wasted in sink with CATHRYN AYERS, RN after pt expiration.

## 2014-12-11 NOTE — Progress Notes (Signed)
[facility] Physician-Brief Progress Note Patient Name: Nicole Cannon DOB: 1946-04-21 MRN: [id number]   Date of Service  12/13/2014  HPI/Events of Note  Spoke with son who stated that he wanted to make patient comfort care and terminal extubation  eICU Interventions  Comfort care orders and terminal extubation.      Intervention Category Intermediate Interventions: Other:  Timtohy Broski 12/22/2014, 6:15 PM

## 2014-12-12 NOTE — Progress Notes (Signed)
Patient died about 10-15 minutes prior to Chaplain arrival.  Family originally accepted [facility] and then declined at [facility] arrival.  Patient's sister spent some 20 minutes voicing her grief and reservations about her sister's choices that the sister believes led to this situation sooner than it had to be.  She felt much better after voicing her perspectives and having someone listen and recognize the complexity of her grief.  Loann Quill, Chaplain Pager: [phone]

## 2014-12-13 NOTE — Progress Notes (Signed)
Discharge summary sent to payer through [facility]  

## 2014-12-16 ENCOUNTER — Telehealth: Payer: Self-pay

## 2014-12-16 NOTE — Telephone Encounter (Signed)
Received [employer] death certificate 12/18/79 from [place] for Dr. Lamonte Sakai.  Will deliver to [facility] for signature.  Received back 12/17/14.  Called [employer] for pick-up as well as faxed.

## 2014-12-28 NOTE — Discharge Summary (Signed)
PULMONARY / CRITICAL CARE MEDICINE DEATH SUMMARY    Name: Nicole Cannon MRN: [id number] DOB: [birth year]    ADMISSION DATE:  12-16-2014  DATE OF DEATH: 2014-12-25  PRIMARY SERVICE: PCCM  CHIEF COMPLAINT:  Weakness, abdominal distension  FINAL CAUSE OF DEATH Septic shock  SECONDARY CAUSES OF DEATH Bowel perforation and severe peritonitis Intra-abdominal abscess, status post percutaneous drainage Distal bowel obstruction Newly diagnosed 16 cm lower abdominal mass, felt to represent either colon, uterine or ovarian cancer Pelvic carcinomatosis consistent with metastatic cancer, unclear primary Toxic metabolic encephalopathy Acute renal failure Hypokalemia Hypercalcemia Coagulopathy due to sepsis Thrombocytopenia due to sepsis Protein calorie malnutrition Anemia of chronic disease Chronic diastolic CHF   Hospital course: 69 year old woman with past medical history except for irritable bowel syndrome. She was evaluated on 12/16/14 for generalized weakness and 2 weeks of lower show any edema and slight abdominal distention. She was found to have renal insufficiency and hyperkalemia. Evaluation of her abdomen by non-contrasted CT scan revealed abdominal masses and a probable intra-abdominal abscess. She was started on empiric antibiotics. A contrasted CT abdomen was performed on 2/27 revealing an advanced pelvic and abdominal malignancy with carcinomatosis of unclear primary origin but suspected to be either colon cancer or uterine cancer. There were multiple fluid collections consistent with abscesses. Possible expiratory laparotomy and resection was considered by general surgery but it was felt that the patient extremely low likelihood of a recovery from such a surgery. She began to involve signs and symptoms consistent with septic shock it was made to ICU. On 2/27 she was electively intubated and underwent percutaneous drainage of her abdominal fluid collections in interventional radiology.   She developed fulminant septic shock and was treated with IV fluids and pressors. Her course was complicated by toxic metabolic encephalopathy. Unfortunately the severity of her disease and her overall weakness and malnutrition made it impossible for her to be successfully extubated. Given the underlying malignancy it was felt that chance for meaningful recovery even with successful drainage of her peritoneal abscesses was extremely small. She was supported fully and followed for several days with hopes of accomplishing an awake extubation so that she can interact with family while transitioning to hospice care. Unfortunately she did not improve to the point where an awake extubation was possible and we instead proceeded with a formal withdrawal of care and comfort measures on December 25, 2014.    SIGNIFICANT STUDIES:  2/27 CT Ab> Large 16cm mass in lower abdomen and pelvis causing distal obstruction, suspicious for colon carcinoma, consider uterin or ovarian carcinoma, 9cm extraluminal gass in small bowel mesentery, consistent with localise abscess, no peritoneal free air, small liver lesions.  Mild retroperitoneal lymphadenopathy, small right pleural effusion 2/27 TTE> LVEF 60%, grade 1 DD, mitral valve annulus calcified, small pericardial effusion   Baltazar Apo, MD, PhD 12/28/2014, 4:45 PM [facility] [phone] or if no answer [phone]

## 2015-01-07 DEATH — deceased

## 2016-07-25 IMAGING — CT CT CORE BIOPSY RENAL
1 series · 1 of 32 positions shown · non-contrast
Comparison: none

CLINICAL DATA: Multiple abscess ease

[Series 2: localizer · axial · 5.0mm · 0.80mm/px · 1 of 90 slices shown]
[im 46/90]
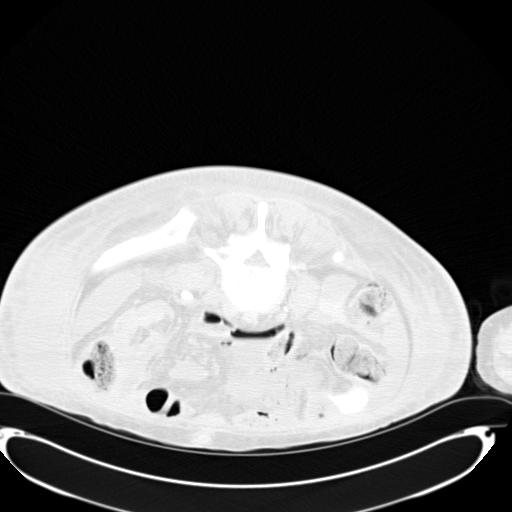

[1 of 32 positions shown; findings below may reference images not displayed]

EXAM:
CT-guided abscess x2

FLUOROSCOPY TIME:  None

MEDICATIONS AND MEDICAL HISTORY:
Patient was intubated

ANESTHESIA/SEDATION:
Intubated

CONTRAST:  Nine

PROCEDURE:
The procedure, risks, benefits, and alternatives were explained to
the patient. Questions regarding the procedure were encouraged and
answered. The patient understands and consents to the procedure.

The back in the prone position was prepped with Betadine in a
sterile fashion, and a sterile drape was applied covering the
operative field. A sterile gown and sterile gloves were used for the
procedure.

Under CT guidance, an 18 gauge needle was inserted into the
mesenteric abscess via posterior left paraspinal approach. It was
removed over an Amplatz. A 12 French dilator followed by a 12 French
drain were inserted.

The right flank was then prepped and draped in a sterile fashion in
the prone position. Under CT guidance, an 18 gauge needle was
inserted into the abscess in the right pericolic gutter and removed
over an Amplatz. A 12 French dilator followed by a 12 French drain
were inserted. Drains were looped and string fixed and sewn to the
skin. Pus was aspirated from both sites.
FINDINGS: Images document peritoneal drain placement x2 as described.

COMPLICATIONS:
None
IMPRESSION: Successful peritoneal drain placement x2.

## 2016-07-29 IMAGING — DX DG CHEST 1V PORT
1 series · 1 of 1 positions shown · non-contrast
Comparison: 12/06/2014.

CLINICAL DATA: Respiratory failure.

EXAM:
PORTABLE CHEST - 1 VIEW

[chest ap]
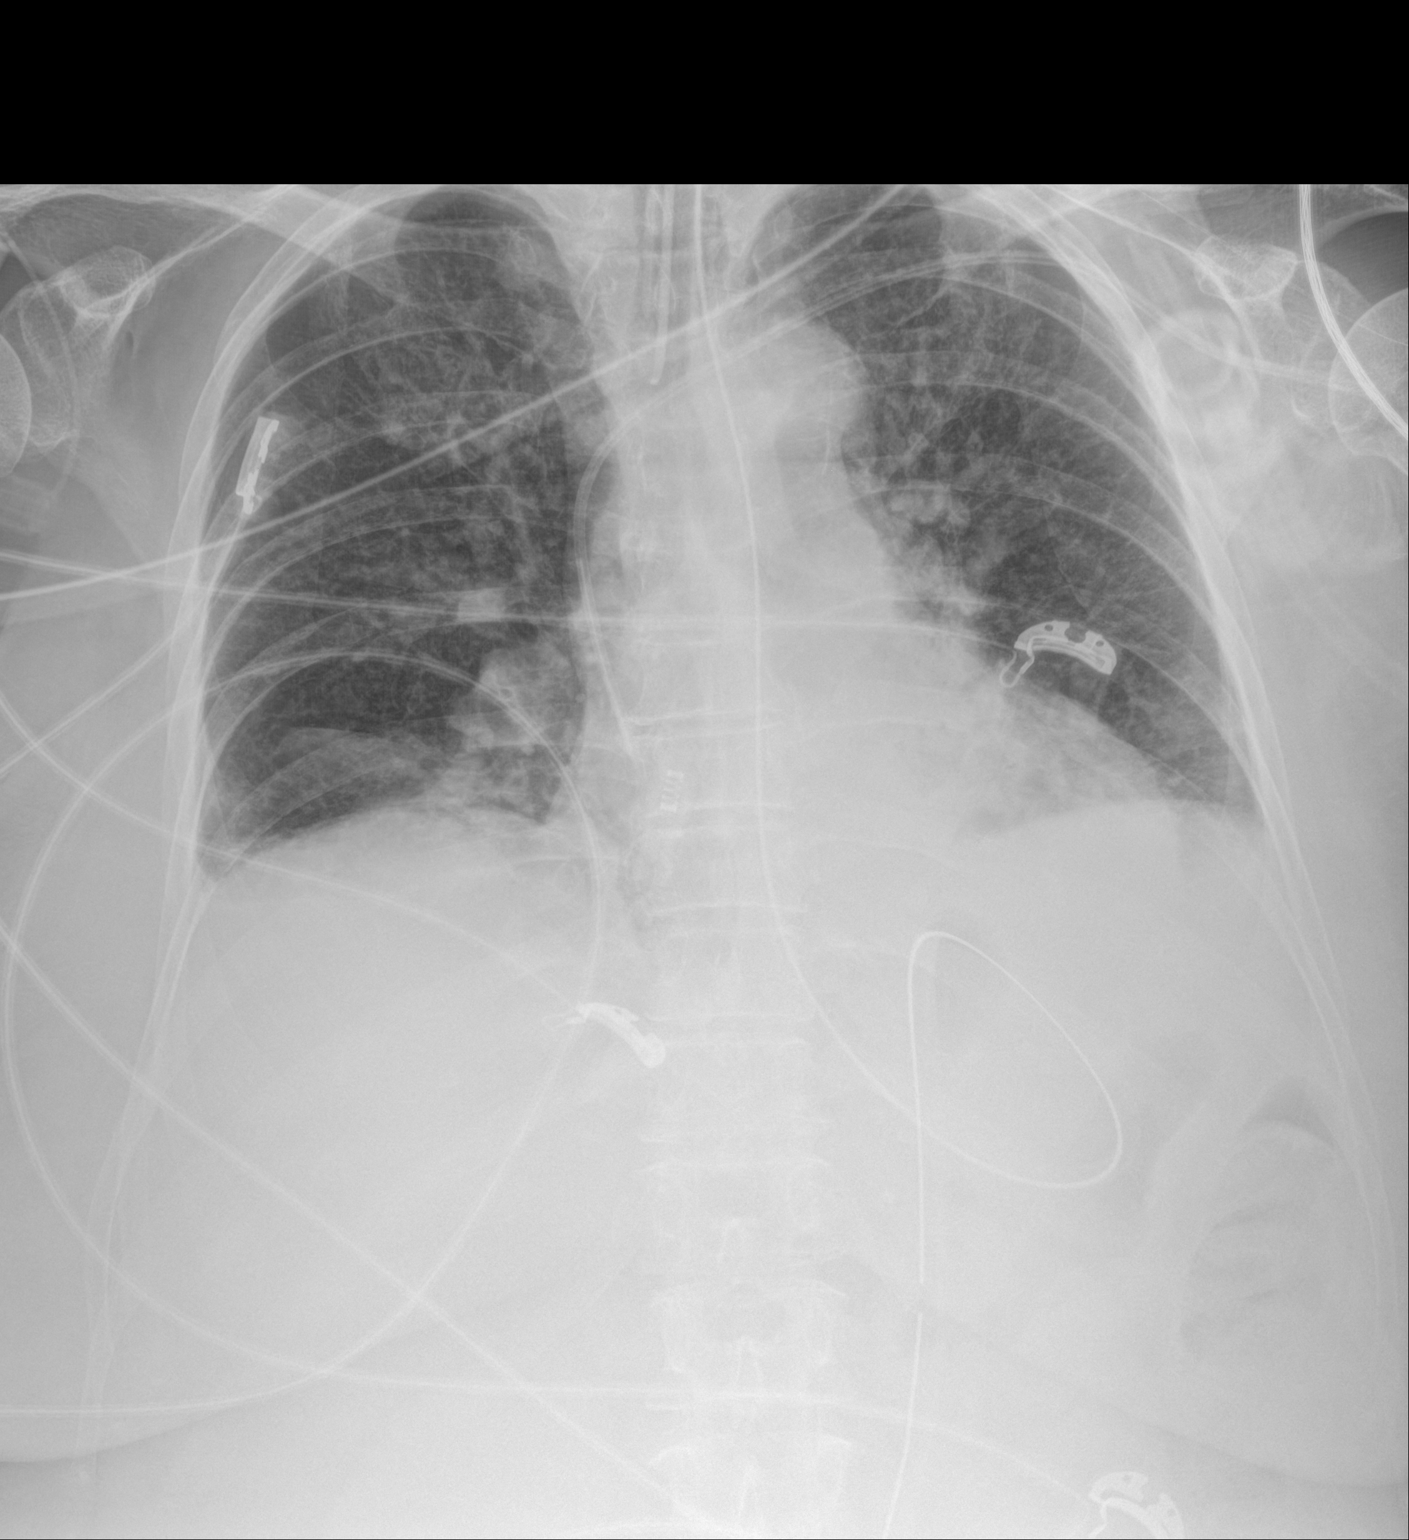

[1 of 1 positions shown; findings below may reference images not displayed]

FINDINGS: Endotracheal tube, NG tube, left subclavian central in stable
position. Mediastinum hilar structures are unremarkable. Mild
bibasilar subsegmental atelectasis again noted. No pleural effusion
or pneumothorax. Heart size stable. No acute bony abnormality.
IMPRESSION: 1. Lines and tubes in stable position.
2. Mild bibasilar subsegmental atelectasis.
